# Patient Record
Sex: Female | Born: 1975 | Hispanic: Yes | Marital: Married | Smoking: Never smoker
Health system: Southern US, Community
[De-identification: ages and names within clinical notes are randomized; demographics above are authoritative.]

## PROBLEM LIST (undated history)

## (undated) ENCOUNTER — Inpatient Hospital Stay (HOSPITAL_COMMUNITY): Payer: Self-pay

## (undated) DIAGNOSIS — Z789 Other specified health status: Secondary | ICD-10-CM

## (undated) HISTORY — DX: Other specified health status: Z78.9

## (undated) HISTORY — PX: EYE SURGERY: SHX253

---

## 2011-04-08 NOTE — L&D Delivery Note (Signed)
I have seen and examined this patient and I agree with the above. Cam Hai 11:57 PM 03/26/2012

## 2011-04-08 NOTE — L&D Delivery Note (Signed)
Delivery Note At 9:18 PM a viable female was delivered via Vaginal, Spontaneous Delivery (Presentation: ; Occiput Anterior).  APGAR: 8, 9; weight is pending .   Placenta status: Intact, Spontaneous.  Cord: 3 vessels with the following complications: None.  Anesthesia: Epidural  Episiotomy: None Lacerations: 2nd degree, perineal Suture Repair: 3.0 vicryl Est. Blood Loss (mL): 200  Mom to postpartum.  Baby to nursery-stable.  Governor Specking 03/26/2012, 9:48 PM

## 2011-12-17 ENCOUNTER — Ambulatory Visit (INDEPENDENT_AMBULATORY_CARE_PROVIDER_SITE_OTHER): Payer: Self-pay | Admitting: Advanced Practice Midwife

## 2011-12-17 ENCOUNTER — Encounter: Payer: Self-pay | Admitting: Advanced Practice Midwife

## 2011-12-17 VITALS — BP 101/71 | Temp 97.4°F | Ht 61.02 in | Wt 164.8 lb

## 2011-12-17 DIAGNOSIS — O99210 Obesity complicating pregnancy, unspecified trimester: Secondary | ICD-10-CM

## 2011-12-17 DIAGNOSIS — E669 Obesity, unspecified: Secondary | ICD-10-CM

## 2011-12-17 DIAGNOSIS — O0930 Supervision of pregnancy with insufficient antenatal care, unspecified trimester: Secondary | ICD-10-CM

## 2011-12-17 LAB — HIV ANTIBODY (ROUTINE TESTING W REFLEX): HIV: NONREACTIVE

## 2011-12-17 LAB — POCT URINALYSIS DIP (DEVICE)
Nitrite: NEGATIVE
Specific Gravity, Urine: 1.02 (ref 1.005–1.030)
Urobilinogen, UA: 0.2 mg/dL (ref 0.0–1.0)
pH: 7 (ref 5.0–8.0)

## 2011-12-17 NOTE — Progress Notes (Signed)
   Subjective:    Mackenzie Bennett is a [id number] [redacted]w[redacted]d being seen today for her first obstetrical visit.  Her obstetrical history is significant for Late to care.  Has not had prenatal care yet. Delivered other babies in [place], without complications.  Had an informal [place] in [place] with a friend. Denies pain or bleeding. States had normal deliveries with no problems.   Patient does intend to breast feed. Pregnancy history fully reviewed.  Patient reports no complaints.  Filed Vitals:   12/17/11 0833 12/17/11 0839  BP: 101/71   Temp: 97.4 F (36.3 C)   Height:  5' 1.02" (1.55 m)  Weight: 164 lb 12.8 oz (74.753 kg)     HISTORY: OB History    Grav Para Term Preterm Abortions TAB SAB Ect Mult Living   3 2 2   0 0 0   2     # Outc Date GA Lbr Len/2nd Wgt Sex Del Anes PTL Lv   1 TRM 6/04 [redacted]w[redacted]d  6lb(2.722kg) F SVD EPI No Yes   2 TRM 6/06 [redacted]w[redacted]d  6lb(2.722kg) F SVD EPI No Yes   3 CUR              Past Medical History  Diagnosis Date  . No pertinent past medical history    Past Surgical History  Procedure Date  . Eye surgery     both eyes, for pupil   Family History  Problem Relation Age of Onset  . Hypertension Mother   . Cancer Maternal Uncle     lung     Exam    Uterus:     Pelvic Exam:    Perineum: No Hemorrhoids, Normal Perineum   Vulva: normal, Bartholin's, Urethra, Skene's normal   Vagina:  normal mucosa, normal discharge   pH:    Cervix: multiparous appearance   Adnexa: normal adnexa and no mass, fullness, tenderness   Bony Pelvis: gynecoid  System: Breast:  normal appearance, no masses or tenderness   Skin: normal coloration and turgor, no rashes    Neurologic: oriented   Extremities: normal strength, tone, and muscle mass   HEENT    Mouth/Teeth mucous membranes moist, pharynx normal without lesions   Neck supple and no masses   Cardiovascular: regular rate and rhythm, no murmurs or gallops   Respiratory:  appears well, vitals normal, no  respiratory distress, acyanotic, normal RR, ear and throat exam is normal, neck free of mass or lymphadenopathy, chest clear, no wheezing, crepitations, rhonchi, normal symmetric air entry   Abdomen: soft, non-tender; bowel sounds normal; no masses,  no organomegaly   Urinary: urethral meatus normal      Assessment:    Pregnancy: [id number] There is no problem list on file for this patient.       Plan:     Initial labs drawn. Prenatal vitamins. Problem list reviewed and updated. Genetic Screening discussed First Screen: declined.(too late)  Ultrasound discussed; fetal survey: ordered.  Follow up in 4 weeks. 50% of 20 min visit spent on counseling and coordination of care.   Prenatal labs done Glucola done Pap and cultures done    [facility] 12/17/2011

## 2011-12-17 NOTE — Patient Instructions (Signed)
Embarazo - Segundo trimestre (Pregnancy - Second Trimester) El segundo trimestre del embarazo (del 3 al 6mes) es un perodo de evolucin rpida para usted y el beb. Hacia el final del sexto mes, el beb mide aproximadamente 23 cm y pesa 680 g. Comenzar a sentir los movimientos del beb entre las 18 y las 20 semanas de embarazo. Podr sentir las pataditas ("quickening en ingls"). Hay un rpido aumento de peso. Puede segregar un lquido claro (calostro) de las mamas. Quizs sienta pequeas contracciones en el vientre (tero) Esto se conoce como falso trabajo de parto o contracciones de Braxton-Hicks. Es como una prctica del trabajo de parto que se produce cuando el beb est listo para salir. Generalmente los problemas de vmitos matinales ya se han superado hacia el final del primer trimestre. Algunas mujeres desarrollan pequeas manchas oscuras (que se denominan cloasma, mscara del embarazo) en la cara que normalmente se van luego del nacimiento del beb. La exposicin al sol empeora las manchas. Puede desarrollarse acn en algunas mujeres embarazadas, y puede desaparecer en aquellas que ya tienen acn. EXAMENES PRENATALES  Durante los exmenes prenatales, deber seguir realizando pruebas de sangre, segn avance el embarazo. Estas pruebas se realizan para controlar su salud y la del beb. Tambin se realizan anlisis de sangre para conocer los niveles de hemoglobina. La anemia (bajo nivel de hemoglobina) es frecuente durante el embarazo. Para prevenirla, se administran hierro y vitaminas. Tambin se le realizarn exmenes para saber si tiene diabetes entre las 24 y las 28 semanas del embarazo. Podrn repetirle algunas de las pruebas que le hicieron previamente.   En cada visita le medirn el tamao del tero. Esto se realiza para asegurarse de que el beb est creciendo correctamente de acuerdo al estado del embarazo.   Tambin en cada visita prenatal controlarn su presin arterial. Esto se realiza  para asegurarse de que no tenga toxemia.   Se controlar su orina para asegurarse de que no tenga infecciones, diabetes o protena en la orina.   Se controlar su peso regularmente para asegurarse que el aumento ocurre al ritmo indicado. Esto se hace para asegurarse que usted y el beb tienen una evolucin normal.   En algunas ocasiones se realiza una prueba de ultrasonido para confirmar el correcto desarrollo y evolucin del beb. Esta prueba se realiza con ondas sonoras inofensivas para el beb, de modo que el profesional pueda calcular ms precisamente la fecha del parto.  Algunas veces se realizan pruebas especializadas del lquido amnitico que rodea al beb. Esta prueba se denomina amniocentesis. El lquido amnitico se obtiene introduciendo una aguja en el vientre (abdomen). Se realiza para controlar los cromosomas en aquellos casos en los que existe alguna preocupacin acerca de algn problema gentico que pueda sufrir el beb. En ocasiones se lleva a cabo cerca del final del embarazo, si es necesario inducir al parto. En este caso se realiza para asegurarse que los pulmones del beb estn lo suficientemente maduros como para que pueda vivir fuera del tero. CAMBIOS QUE OCURREN EN EL SEGUNDO TRIMESTRE DEL EMBARAZO Su organismo atravesar numerosos cambios durante el embarazo. Estos pueden variar de una persona a otra. Converse con el profesional que la asiste acerca los cambios que usted note y que la preocupen.  Durante el segundo trimestre probablemente sienta un aumento del apetito. Es normal tener "antojos" de ciertas comidas. Esto vara de una persona a otra y de un embarazo a otro.   El abdomen inferior comenzar a abultarse.   Podr tener la necesidad   de orinar con ms frecuencia debido a que el tero y el beb presionan sobre la vejiga. Tambin es frecuente contraer ms infecciones urinarias durante el embarazo (dolor al orinar). Puede evitarlas bebiendo gran cantidad de lquidos y  vaciando la vejiga antes y despus de mantener relaciones sexuales.   Podrn aparecer las primeras estras en las caderas, abdomen y mamas. Estos son cambios normales del cuerpo durante el embarazo. No existen medicamentos ni ejercicios que puedan prevenir estos cambios.   Es posible que comience a desarrollar venas inflamadas y abultadas (varices) en las piernas. El uso de medias de descanso, elevar sus pies durante 15 minutos, 3 a 4 veces al da y limitar la sal en su dieta ayuda a aliviar el problema.   Podr sentir acidez gstrica a medida que el tero crece y presiona contra el estmago. Puede tomar anticidos, con la autorizacin de su mdico, para aliviar este problema. Tambin es til ingerir pequeas comidas 4 a 5 veces al da.   La constipacin puede tratarse con un laxante o agregando fibra a su dieta. Beber grandes cantidades de lquidos, comer vegetales, frutas y granos integrales es de gran ayuda.   Tambin es beneficioso practicar actividad fsica. Si ha sido una persona activa hasta el embarazo, podr continuar con la mayora de las actividades durante el mismo. Si ha sido menos activa, puede ser beneficioso que comience con un programa de ejercicios, como realizar caminatas.   Puede desarrollar hemorroides (vrices en el recto) hacia el final del segundo trimestre. Tomar baos de asiento tibios y utilizar cremas recomendadas por el profesional que lo asiste sern de ayuda para los problemas de hemorroides.   Tambin podr sentir dolor de espalda durante este momento de su embarazo. Evite levantar objetos pesados, utilice zapatos de taco bajo y mantenga una buena postura para ayudar a reducir los problemas de espalda.   Algunas mujeres embarazadas desarrollan hormigueo y adormecimiento de la mano y los dedos debido a la hinchazn y compresin de los ligamentos de la mueca (sndrome del tnel carpiano). Esto desaparece una vez que el beb nace.   Como sus pechos se agrandan,  necesitar un sujetador ms grande. Use un sostn de soporte, cmodo y de algodn. No utilice un sostn para amamantar hasta el ltimo mes de embarazo si va a amamantar al beb.   Podr observar una lnea oscura desde el ombligo hacia la zona pbica denominada linea nigra.   Podr observar que sus mejillas se ponen coloradas debido al aumento de flujo sanguneo en la cara.   Podr desarrollar "araitas" en la cara, cuello y pecho. Esto desaparece una vez que el beb nace.  INSTRUCCIONES PARA EL CUIDADO DOMICILIARIO  Es extremadamente importante que evite el cigarrillo, hierbas medicinales, alcohol y las drogas no prescriptas durante el embarazo. Estas sustancias qumicas afectan la formacin y el desarrollo del beb. Evite estas sustancias durante todo el embarazo para asegurar el nacimiento de un beb sano.   La mayor parte de los cuidados que se aconsejan son los mismos que los indicados para el primer trimestre del embarazo. Cumpla con las citas tal como se le indic. Siga las instrucciones del profesional que lo asiste con respecto al uso de los medicamentos, el ejercicio y la dieta.   Durante el embarazo debe obtener nutrientes para usted y para su beb. Consuma alimentos balanceados a intervalos regulares. Elija alimentos como carne, pescado, leche y otros productos lcteos descremados, vegetales, frutas, panes integrales y cereales. El profesional le informar cul es el   aumento de peso ideal.   Las relaciones sexuales fsicas pueden continuarse hasta cerca del fin del embarazo si no existen otros problemas. Estos problemas pueden ser la prdida temprana (prematura) de lquido amnitico de las membranas, sangrado vaginal, dolor abdominal u otros problemas mdicos o del embarazo.   Realice actividad fsica todos los das, si no tiene restricciones. Consulte con el profesional que la asiste si no sabe con certeza si determinados ejercicios son seguros. El mayor aumento de peso tiene lugar  durante los ltimos 2 trimestres del embarazo. El ejercicio la ayudar a:   Controlar su peso.   Ponerla en forma para el parto.   Ayudarla a perder peso luego de haber dado a luz.   Use un buen sostn o como los que se usan para hacer deportes para aliviar la sensibilidad de las mamas. Tambin puede serle til si lo usa mientras duerme. Si pierde calostro, podr utilizar apsitos en el sostn.   No utilice la baera con agua caliente, baos turcos y saunas durante el embarazo.   Utilice el cinturn de seguridad sin excepcin cuando conduzca. Este la proteger a usted y al beb en caso de accidente.   Evite comer carne cruda, queso crudo, y el contacto con los utensilios y desperdicios de los gatos. Estos elementos contienen grmenes que pueden causar defectos de nacimiento en el beb.   El segundo trimestre es un buen momento para visitar a su dentista y evaluar su salud dental si an no lo ha hecho. Es importante mantener los dientes limpios. Utilice un cepillo de dientes blando. Cepllese ms suavemente durante el embarazo.   Es ms fcil perder algo de orina durante el embarazo. Apretar y fortalecer los msculos de la pelvis la ayudar con este problema. Practique detener la miccin cuando est en el bao. Estos son los mismos msculos que necesita fortalecer. Son tambin los mismos msculos que utiliza cuando trata de evitar los gases. Puede practicar apretando estos msculos 10 veces, y repetir esto 3 veces por da aproximadamente. Una vez que conozca qu msculos debe apretar, no realice estos ejercicios durante la miccin. Puede favorecerle una infeccin si la orina vuelve hacia atrs.   Pida ayuda si tiene necesidades econmicas, de asesoramiento o nutricionales durante el embarazo. El profesional podr ayudarla con respecto a estas necesidades, o derivarla a otros especialistas.   La piel puede ponerse grasa. Si esto sucede, lvese la cara con un jabn suave, utilice un humectante no  graso y maquillaje con base de aceite o crema.  CONSUMO DE MEDICAMENTOS Y DROGAS DURANTE EL EMBARAZO  Contine tomando las vitaminas apropiadas para esta etapa tal como se le indic. Las vitaminas deben contener un miligramo de cido flico y deben suplementarse con hierro. Guarde todas las vitaminas fuera del alcance de los nios. La ingestin de slo un par de vitaminas o tabletas que contengan hierro puede ocasionar la muerte en un beb o en un nio pequeo.   Evite el uso de medicamentos, inclusive los de venta libre y hierbas que no hayan sido prescriptos o indicados por el profesional que la asiste. Algunos medicamentos pueden causar problemas fsicos al beb. Utilice los medicamentos de venta libre o de prescripcin para el dolor, el malestar o la fiebre, segn se lo indique el profesional que lo asiste. No utilice aspirina.   El consumo de alcohol est relacionado con ciertos defectos de nacimiento. Esto incluye el sndrome de alcoholismo fetal. Debe evitar el consumo de alcohol en cualquiera de sus formas. El cigarrillo   causa nacimientos prematuros y bebs de bajo peso. El uso de drogas recreativas est absolutamente prohibido. Son muy nocivas para el beb. Un beb que nace de una madre adicta, ser adicto al nacer. Ese beb tendr los mismos sntomas de abstinencia que un adulto.   Infrmele al profesional si consume alguna droga.   No consuma drogas ilegales. Pueden causarle mucho dao al beb.  SOLICITE ATENCIN MDICA SI: Tiene preguntas o preocupaciones durante su embarazo. Es mejor que llame para consultar las dudas que esperar hasta su prxima visita prenatal. De esta forma se sentir ms tranquila.  SOLICITE ATENCIN MDICA DE INMEDIATO SI:  La temperatura oral se eleva sin motivo por encima de 102 F (38.9 C) o segn le indique el profesional que lo asiste.   Tiene una prdida de lquido por la vagina (canal de parto). Si sospecha una ruptura de las membranas, tmese la  temperatura y llame al profesional para informarlo sobre esto.   Observa unas pequeas manchas, una hemorragia vaginal o elimina cogulos. Notifique al profesional acerca de la cantidad y de cuntos apsitos est utilizando. Unas pequeas manchas de sangre son algo comn durante el embarazo, especialmente despus de mantener relaciones sexuales.   Presenta un olor desagradable en la secrecin vaginal y observa un cambio en el color, de transparente a blanco.   Contina con las nuseas y no obtiene alivio de los remedios indicados. Vomita sangre o algo similar a la borra del caf.   [place] o sube ms de 900 g. en una semana, o segn lo indicado por el profesional que la asiste.   Observa que se le hinchan el rostro, las manos, los pies o las piernas.   Ha estado expuesta a la rubola y no ha sufrido la enfermedad.   Ha estado expuesta a la quinta enfermedad o a la varicela.   Presenta dolor abdominal. Las molestias en el ligamento redondo son una causa no cancerosa (benigna) frecuente de dolor abdominal durante el embarazo. El profesional que la asiste deber evaluarla.   Presenta dolor de cabeza intenso que no se alivia.   Presenta fiebre, diarrea, dolor al orinar o le falta la respiracin.   Presenta dificultad para ver, visin borrosa, o visin doble.   Sufre una cada, un accidente de trnsito o cualquier tipo de trauma.   Vive en un hogar en el que existe violencia fsica o mental.  Document Released: 01/01/2005 Document Revised: 03/13/2011 [employer] Patient Information 2012 [employer]. 

## 2011-12-17 NOTE — Progress Notes (Signed)
Pulse: 88 Pt notices a slight odor with yellow discharge. 1hr gtt today due at 940

## 2011-12-18 LAB — OBSTETRIC PANEL
Antibody Screen: NEGATIVE
Basophils Absolute: 0 10*3/uL (ref 0.0–0.1)
Eosinophils Absolute: 0.1 10*3/uL (ref 0.0–0.7)
Eosinophils Relative: 1 % (ref 0–5)
HCT: 37.9 % (ref 36.0–46.0)
Lymphocytes Relative: 15 % (ref 12–46)
Lymphs Abs: 1.4 10*3/uL (ref 0.7–4.0)
MCH: 30.9 pg (ref 26.0–34.0)
MCV: 92.2 fL (ref 78.0–100.0)
Monocytes Absolute: 0.5 10*3/uL (ref 0.1–1.0)
Platelets: 250 10*3/uL (ref 150–400)
RDW: 13.7 % (ref 11.5–15.5)
Rubella: 220.2 IU/mL — ABNORMAL HIGH
WBC: 9.6 10*3/uL (ref 4.0–10.5)

## 2011-12-19 ENCOUNTER — Ambulatory Visit (HOSPITAL_COMMUNITY)
Admission: RE | Admit: 2011-12-19 | Discharge: 2011-12-19 | Disposition: A | Discharge status: Home / Self Care | Payer: Self-pay | Source: Ambulatory Visit | Attending: Advanced Practice Midwife | Admitting: Advanced Practice Midwife

## 2011-12-19 DIAGNOSIS — O0930 Supervision of pregnancy with insufficient antenatal care, unspecified trimester: Secondary | ICD-10-CM | POA: Insufficient documentation

## 2011-12-19 DIAGNOSIS — Z1389 Encounter for screening for other disorder: Secondary | ICD-10-CM | POA: Insufficient documentation

## 2011-12-19 DIAGNOSIS — Z363 Encounter for antenatal screening for malformations: Secondary | ICD-10-CM | POA: Insufficient documentation

## 2011-12-19 DIAGNOSIS — O09529 Supervision of elderly multigravida, unspecified trimester: Secondary | ICD-10-CM | POA: Insufficient documentation

## 2011-12-19 DIAGNOSIS — O358XX0 Maternal care for other (suspected) fetal abnormality and damage, not applicable or unspecified: Secondary | ICD-10-CM | POA: Insufficient documentation

## 2011-12-19 LAB — HEMOGLOBINOPATHY EVALUATION
Hgb F Quant: 0.3 % (ref 0.0–2.0)
Hgb S Quant: 0 %

## 2011-12-20 LAB — CULTURE, OB URINE: Colony Count: 50000

## 2011-12-23 ENCOUNTER — Encounter: Payer: Self-pay | Admitting: Advanced Practice Midwife

## 2011-12-23 DIAGNOSIS — O359XX0 Maternal care for (suspected) fetal abnormality and damage, unspecified, not applicable or unspecified: Secondary | ICD-10-CM | POA: Insufficient documentation

## 2011-12-25 ENCOUNTER — Encounter: Payer: Self-pay | Admitting: Advanced Practice Midwife

## 2011-12-25 DIAGNOSIS — Z3490 Encounter for supervision of normal pregnancy, unspecified, unspecified trimester: Secondary | ICD-10-CM | POA: Insufficient documentation

## 2012-01-14 ENCOUNTER — Ambulatory Visit (INDEPENDENT_AMBULATORY_CARE_PROVIDER_SITE_OTHER): Payer: Self-pay | Admitting: Obstetrics and Gynecology

## 2012-01-14 VITALS — BP 113/69 | Temp 97.1°F | Wt 167.1 lb

## 2012-01-14 DIAGNOSIS — O359XX0 Maternal care for (suspected) fetal abnormality and damage, unspecified, not applicable or unspecified: Secondary | ICD-10-CM

## 2012-01-14 DIAGNOSIS — O0930 Supervision of pregnancy with insufficient antenatal care, unspecified trimester: Secondary | ICD-10-CM

## 2012-01-14 LAB — POCT URINALYSIS DIP (DEVICE)
Bilirubin Urine: NEGATIVE
Glucose, UA: NEGATIVE mg/dL
Ketones, ur: NEGATIVE mg/dL
Nitrite: NEGATIVE

## 2012-01-14 NOTE — Progress Notes (Signed)
F/U anatomy scheduled for next week. Discussed LARC and infor given as she has only used condoms in past due to fear of infections. Mild gingivitis - discussed dental hygiene.

## 2012-01-14 NOTE — Progress Notes (Signed)
Pulse- 83  Edema-"swollen gums; happened in my last pregnancy"  Pain- right side "I am on my feet a lot at work"

## 2012-01-14 NOTE — Patient Instructions (Signed)
Eleccin del mtodo anticonceptivo  (Contraception Choices) El control de la natalidad (contracecin) impide que el embarazo ocurra. Los diferentes tipos de anticonceptivos funcionan de diferentes maneras. Algunos pueden:   Hacer que el moco del cuello del tero se espese. Esto les dificulta a los espermatozoides llegar al tero.  Hacer ms delgado el el revestimiento del tero. Esto dificulta que el vulo se adhiera a la pared del tero.  Impedir que los ovarios liberen un vulo.  Impedir que el esperma llegue al vulo. Con ciertos tipos de ciruga se puede impedir que el embarazo ocurra. En las mujeres, una ciruga cierra las trompas de Falopio (ligadura de trompas). En los hombres, la ciruga impide que los espermatozoides se liberen durante el acto sexual (vasectoma).  ANTICONCEPTIVOS HORMONALES  Los anticonceptivos hormonales impiden el embarazo, agregando hormonas al organismo. Los tipos de anticonceptivos son:  Un pequeo tubo colocado bajo la piel de la parte superior del brazo (implante). El tubo puede permanecer en el lugar durante 3 aos.  Vacunas administradas cada 3 meses.  Pastillas que se toman todos los das o una vez despus de tener sexo (relaciones sexuales).  Parches que se cambian una vez por semana.  Un anillo que se coloca en la vagina (anillos vaginales). El anillo se deja en su lugar durante 3 semanas y se retira durante 1 semana Luego se coloca un nuevo anillo. ANTICONCEPTIVOS DE BARRERA  Los anticonceptivos de barrera impiden que los espermatozoides lleguen al vulo. Estos tipos de anticonceptivos son:   Una cubierta delgada que se usa sobe el pene (condn masculino) que se coloca durante las relaciones sexuales.  Una cubierta blanda y suelta que se coloca en la vagina (condn femenino) antes de las relaciones sexuales.  Un cuenco de goma que se aplica sobre el cuello del tero (diafragma). Este cuenco debe hacerse para usted. Se coloca en la vagina antes de  tener relaciones sexuales. Debe dejar el diafragma colocado en la vagina durante 6 a 8 horas despus de las relaciones sexuales.  Un capuchn pequeo y suave que se fijo sobre el cuello del tero (capuchn cervical). Este capuchn debe hacerse para usted. Debe dejarlo colocado en la vagina durante 48 horas despus de las relaciones sexuales.  Una esponja que se coloca en la vagina antes de tener relaciones sexuales.  Una sustancia qumica que destruye o impide que los espermatozoides ingresen al cuello y al tero (espermicida). La sustancia qumica puede ser en crema, gel, espuma o pldoras. DISPOSITIVO DE CONTROL INTRAUTERINO (DIU)  El DIU es un pequeo dispositivo plstico en forma de T. Se coloca dentro del tero. Hay dos tipos de DIU:   DIU de cobre El dispositivo est cubierto en alambre de cobre. El cobre produce un lquido que destruye los espermatozoides. Puede permanecer colocado durante 10 aos.  DIU hormonal La hormona impide que ocurra el embarazo. Puede permanecer colocado durante 5 aos. CONTROL DE LA NATALIDAD POR PLANIFICACIN FAMILIAR NATURAL  La planificacin familiar natural significa no tener relaciones sexuales o usar un mtodo anticonceptivo de barrera en los perodos frtiles de la mujer. Una mujer puede:   Usar un calendario para saber cul es su perodo frtil.  Emplear un termmetro para medir la temperatura corporal. Protjase de las enfermedades de transmisin sexual cualquiera sea el mtodo que utilice. Hable con su mdico acerca de cul es el mejor mtodo anticonceptivo para usted.  Document Released: 07/09/2010 Document Revised: 06/16/2011 [employer] Patient Information 2013 [employer].  

## 2012-01-21 ENCOUNTER — Ambulatory Visit (HOSPITAL_COMMUNITY)
Admission: RE | Admit: 2012-01-21 | Discharge: 2012-01-21 | Disposition: A | Discharge status: Home / Self Care | Payer: Self-pay | Source: Ambulatory Visit | Attending: Obstetrics and Gynecology | Admitting: Obstetrics and Gynecology

## 2012-01-21 DIAGNOSIS — O09529 Supervision of elderly multigravida, unspecified trimester: Secondary | ICD-10-CM | POA: Insufficient documentation

## 2012-01-21 DIAGNOSIS — O0930 Supervision of pregnancy with insufficient antenatal care, unspecified trimester: Secondary | ICD-10-CM | POA: Insufficient documentation

## 2012-01-21 DIAGNOSIS — Z363 Encounter for antenatal screening for malformations: Secondary | ICD-10-CM | POA: Insufficient documentation

## 2012-01-21 DIAGNOSIS — Z1389 Encounter for screening for other disorder: Secondary | ICD-10-CM | POA: Insufficient documentation

## 2012-01-21 DIAGNOSIS — O358XX0 Maternal care for other (suspected) fetal abnormality and damage, not applicable or unspecified: Secondary | ICD-10-CM | POA: Insufficient documentation

## 2012-01-21 DIAGNOSIS — O359XX0 Maternal care for (suspected) fetal abnormality and damage, unspecified, not applicable or unspecified: Secondary | ICD-10-CM

## 2012-01-28 ENCOUNTER — Ambulatory Visit (INDEPENDENT_AMBULATORY_CARE_PROVIDER_SITE_OTHER): Payer: Self-pay | Admitting: Family

## 2012-01-28 VITALS — BP 107/66 | Temp 97.3°F | Wt 168.4 lb

## 2012-01-28 DIAGNOSIS — Z3490 Encounter for supervision of normal pregnancy, unspecified, unspecified trimester: Secondary | ICD-10-CM

## 2012-01-28 DIAGNOSIS — O359XX0 Maternal care for (suspected) fetal abnormality and damage, unspecified, not applicable or unspecified: Secondary | ICD-10-CM

## 2012-01-28 LAB — POCT URINALYSIS DIP (DEVICE)
Glucose, UA: NEGATIVE mg/dL
Ketones, ur: NEGATIVE mg/dL
Specific Gravity, Urine: 1.02 (ref 1.005–1.030)

## 2012-01-28 MED ORDER — NITROFURANTOIN MONOHYD MACRO 100 MG PO CAPS: 100.0000 mg | ORAL_CAPSULE | Freq: Two times a day (BID) | ORAL | Status: DC

## 2012-01-28 NOTE — Progress Notes (Signed)
Pulse  91.  Edema trace in feet. States having a thin, clear, "moist" vaginal d/c.

## 2012-01-28 NOTE — Progress Notes (Signed)
Reviews repeat US for recheck cerebral ventricle (normal); reports contractions approx 3x a day; reports intermittent dysuria, large leuk in urine; RX macrobid and urine culture.  Pt denies leaking of fluid, "moist".

## 2012-01-31 LAB — CULTURE, OB URINE: Colony Count: >=100000

## 2012-02-11 ENCOUNTER — Encounter: Payer: Self-pay | Admitting: Advanced Practice Midwife

## 2012-02-18 ENCOUNTER — Ambulatory Visit (INDEPENDENT_AMBULATORY_CARE_PROVIDER_SITE_OTHER): Payer: Self-pay | Admitting: Advanced Practice Midwife

## 2012-02-18 VITALS — BP 107/67 | Temp 97.4°F | Wt 172.0 lb

## 2012-02-18 DIAGNOSIS — Z3490 Encounter for supervision of normal pregnancy, unspecified, unspecified trimester: Secondary | ICD-10-CM

## 2012-02-18 DIAGNOSIS — Z23 Encounter for immunization: Secondary | ICD-10-CM

## 2012-02-18 DIAGNOSIS — Z3480 Encounter for supervision of other normal pregnancy, unspecified trimester: Secondary | ICD-10-CM

## 2012-02-18 LAB — POCT URINALYSIS DIP (DEVICE)
Glucose, UA: NEGATIVE mg/dL
Nitrite: NEGATIVE
Protein, ur: NEGATIVE mg/dL
Specific Gravity, Urine: 1.02 (ref 1.005–1.030)
Urobilinogen, UA: 0.2 mg/dL (ref 0.0–1.0)

## 2012-02-18 MED ORDER — INFLUENZA VIRUS VACC SPLIT PF IM SUSP
0.5000 mL | Freq: Once | INTRAMUSCULAR | Status: AC
  Administered 2012-02-18: 0.5 mL via INTRAMUSCULAR

## 2012-02-18 NOTE — Progress Notes (Signed)
Occasional contractions and pressure, nothing regular, no bleeding or LOF. Flu shot today. Rev'd PTL, kick counts.

## 2012-02-18 NOTE — Patient Instructions (Signed)
Embarazo  Tercer trimestre  (Pregnancy - Third Trimester) El tercer trimestre del embarazo (los ltimos 3 meses) es el perodo en el cual tanto usted como su beb crecen con ms rapidez. El beb alcanza un largo de aproximadamente 50 cm. y pesa entre 2,700 y 4,500 kg. El beb gana ms tejido graso y est listo para la vida fuera del cuerpo de la madre. Mientras estn en el interior, los bebs tienen perodos de sueo y vigilia, succionan el pulgar y tienen hipo. Quizs sienta pequeas contracciones del tero. Este es el falso trabajo de parto. Tambin se las conoce como contracciones de Braxton-Hicks . Es como una prctica del parto. Los problemas ms habituales de esta etapa del embarazo incluyen mayor dificultad para respirar, hinchazn de las manos y los pies por retencin de lquidos y la necesidad de orinar con ms frecuencia debido a que el tero y el beb presionan sobre la vejiga.  EXAMENES PRENATALES   Durante los exmenes prenatales, deber seguir realizndose anlisis de sangre. Estas pruebas se realizan para controlar su salud y la del beb. Los anlisis de sangre se realizan para conocer los niveles de algunos compuestos de la sangre (hemoglobina). La anemia (bajo nivel de hemoglobina) es frecuente durante el embarazo. Para prevenirla, se administran hierro y vitaminas. Tambin le tomarn nuevas anlisis para descartar diabetes. Podrn repetirle algunas de las pruebas que le hicieron previamente.  En cada visita le medirn el tamao del tero. Esto permite asegurar que el beb se desarrolla adecuadamente, segn la fecha del embarazo.  Le controlarn la presin arterial en cada visita prenatal. Esto es para asegurarse de que no sufre toxemia.  Le harn un anlisis de orina en cada visita prenatal, para descartar infecciones, diabetes y la presencia de protenas.  Tambin en cada visita controlarn su peso. Esto se realiza para asegurarse que aumenta de peso al ritmo indicado y que usted y su  beb evolucionan normalmente.  En algunas ocasiones se realiza una prueba de ultrasonido para confirmar el correcto desarrollo y evolucin del beb. Esta prueba se realiza con ondas sonoras inofensivas para el beb, de modo que el profesional pueda calcular ms precisamente la fecha del parto.  Analice con su mdico los analgsicos y la anestesia que recibir durante el trabajo de parto y el parto.  Comente la posibilidad de que necesite una cesrea y qu anestesia se recibir.  Informe a su mdico si sufre violencia familiar mental o fsica. A veces, se indica la prueba especializada sin estrs, la prueba de tolerancia a las contracciones y el perfil biofsico para asegurarse de que el beb no tiene problemas. El estudio del lquido amnitico que rodea al beb se llama amniocentesis. El lquido amnitico se obtiene introduciendo una aguja en el vientre (abdomen ). En ocasiones se lleva a cabo cerca del final del embarazo, si es necesario inducir a un parto. En este caso se realiza para asegurarse que los pulmones del beb estn lo suficientemente maduros como para que pueda vivir fuera del tero. Si los pulmones no han madurado y es peligroso que el beb nazca, se administrar a la madre una inyeccin de cortisona , 1 a 2 das antes del parto. . Esto ayuda a que los pulmones del beb maduren y sea ms seguro su nacimiento.  CAMBIOS QUE OCURREN EN EL TERCER TRIMESTRE DEL EMBARAZO  Su organismo atravesar numerosos cambios durante el embarazo. Estos pueden variar de una persona a otra. Converse con el profesional que la asiste acerca los cambios que   usted note y que la preocupen.   Durante el ltimo trimestre probablemente sienta un aumento del apetito. Es normal tener "antojos" de ciertas comidas. Esto vara de una persona a otra y de un embarazo a otro.  Podrn aparecer las primeras estras en las caderas, abdomen y mamas. Estos son cambios normales del cuerpo durante el embarazo. No existen  medicamentos ni ejercicios que puedan prevenir estos cambios.  La constipacin puede tratarse con un laxante o agregando fibra a su dieta. Beber grandes cantidades de lquidos, tomar fibras en forma de vegetales, frutas y granos integrales es de gran ayuda.  Tambin es beneficioso practicar actividad fsica. Si ha sido una persona activa hasta el embarazo, podr continuar con la mayora de las actividades durante el mismo. Si ha sido menos activa, puede ser beneficioso que comience con un programa de ejercicios, como realizar caminatas. Consulte con el profesional que la asiste antes de comenzar un programa de ejercicios.  Evite el consumo de cigarrillos, el alcohol, los medicamentos no recetados y las "drogas de la calle" durante el embarazo. Estas sustancias qumicas afectan la formacin y el desarrollo del beb. Evite estas sustancias durante todo el embarazo para asegurar el nacimiento de un beb sano.  Podr sentir dolor de espalda, tener vrices en las venas y hemorroides, o si ya los sufra, pueden empeorar.  Durante el tercer trimestre se cansar con ms facilidad, lo cual es normal.  Los movimientos del beb pueden ser ms fuertes y con ms frecuencia.  Puede que note dificultades para respirar normalmente.  El ombligo puede salir hacia afuera.  A veces sale una secrecin amarilla de las mamas, que se llama calostro.  Podr aparecer una secrecin mucosa con sangre. Esto suele ocurrir entre unos pocos das y una semana antes del parto. INSTRUCCIONES PARA EL CUIDADO EN EL HOGAR   Cumpla con las citas de control. Siga las indicaciones del mdico con respecto al uso de medicamentos, los ejercicios y la dieta.  Durante el embarazo debe obtener nutrientes para usted y para su beb. Consuma alimentos balanceados a intervalos regulares. Elija alimentos como carne, pescado, leche y otros productos lcteos descremados, vegetales, frutas, panes integrales y cereales. El mdico le informar  cul es el aumento de peso ideal.  Las relaciones sexuales pueden continuarse hasta casi el final del embarazo, si no se presentan otros problemas como prdida prematura (antes de tiempo) de lquido amnitico, hemorragia vaginal o dolor en el vientre (abdominal).  Realice actividad fsica todos los das, si no tiene restricciones. Consulte con el profesional que la asiste si no sabe con certeza si determinados ejercicios son seguros. El mayor aumento de peso se producir en los ltimos 2 trimestres del embarazo. El ejercicio ayuda a:  Controlar su peso.  Mantenerse en forma para el trabajo de parto y el parto .  Perder peso despus del parto.  Haga reposo con frecuencia, con las piernas elevadas, o segn lo necesite para evitar los calambres y el dolor de cintura.  Use un buen sostn o como los que se usan para hacer deportes para aliviar la sensibilidad de las mamas. Tambin puede serle til si lo usa mientras duerme. Si pierde calostro, podr utilizar apsitos en el sostn.  No utilice la baera con agua caliente, baos turcos y saunas.  Colquese el cinturn de seguridad cuando conduzca. Este la proteger a usted y al beb en caso de accidente.  Evite comer carne cruda y el contacto con los utensilios y desperdicios de los gatos. Estos elementos   contienen grmenes que pueden causar defectos de nacimiento en el beb.  Es fcil perder algo de orina durante el embarazo. Apretar y fortalecer los msculos de la pelvis la ayudar con este problema. Practique detener la miccin cuando est en el bao. Estos son los mismos msculos que necesita fortalecer. Son tambin los mismos msculos que utiliza cuando trata de evitar despedir gases. Puede practicar apretando estos msculos diez veces, y repetir esto tres veces por da aproximadamente. Una vez que conozca qu msculos debe apretar, no realice estos ejercicios durante la miccin. Puede favorecerle una infeccin si la orina vuelve hacia  atrs.  Pida ayuda si tienen necesidades financieras, teraputicas o nutricionales. El profesional podr ayudarla con respecto a estas necesidades, o derivarla a otros especialistas.  Haga una lista de nmeros telefnicos de emergencia y tngalos disponibles.  Planifique como obtener ayuda de familiares o amigos cuando regrese a casa desde el hospital.  Hacer un ensayo sobre la partida al hospital.  Tome clases prenatales con el padre para entender, practicar y hacer preguntas sobre el trabajo de parto y el alumbramiento.  Preparar la habitacin del beb / busque una guardera.  No viaje fuera de la ciudad a menos que sea absolutamente necesario y con el asesoramiento de su mdico.  Use slo zapatos de tacn bajo o sin tacn para tener mejor equilibrio y evitar cadas. USO DE MEDICAMENTOS Y CONSUMO DE DROGAS DURANTE EL EMBARAZO   Tome las vitaminas apropiadas para esta etapa tal como se le indic. Las vitaminas deben contener un miligramo de cido flico. Guarde todas las vitaminas fuera del alcance de los nios. La ingestin de slo un par de vitaminas o tabletas que contengan hierro pueden ocasionar la muerte en un beb o en un nio pequeo.  Evite el uso de todos los medicamentos, incluyendo hierbas, medicamentos de venta libre, sin receta o que no hayan sido sugeridos por su mdico. Slo tome medicamentos de venta libre o medicamentos recetados para el dolor, el malestar o fiebre como lo indique su mdico. No tome aspirina, ibuprofeno (Motrin, Advil, Nuprin) o naproxeno (Aleve) excepto que su mdico se lo indique.  Infrmele al profesional si consume alguna droga.  El alcohol se relaciona con ciertos defectos congnitos. Incluye el sndrome de alcoholismo fetal. Debe evitar absolutamente el consumo de alcohol, en cualquier forma. El fumar produce baja tasa de natalidad y bebs prematuros.  Las drogas ilegales o de la calle son muy perjudiciales para el beb. Estn absolutamente  prohibidas. Un beb que nace de una madre adicta, ser adicto al nacer. Ese beb tendr los mismos sntomas de abstinencia que un adulto. SOLICITE ATENCIN MDICA SI:  Tiene preguntas o preocupaciones relacionadas con el embarazo. Es mejor que llame para formular las preguntas si no puede esperar hasta la prxima visita, que sentirse preocupada por ellas.  DECISIONES ACERCA DE LA CIRCUNCISIN  Usted puede saber o no cul es el sexo de su beb. Si ya sabe que ser un varn, este es el momento de pensar acerca de la circuncisin. La circuncisin es la extirpacin del prepucio. Esta es la piel que cubre el extremo sensible del pene. No hay un motivo mdico que lo justifique. Generalmente la decisin se toma segn lo que sea popular en ese momento, o segn creencias religiosas. Podr conversar estos temas con su mdico o con el pediatra.  SOLICITE ATENCIN MDICA DE INMEDIATO SI:   La temperatura oral le sube a ms de 102 F (38.9 C) o lo que su mdico le   indique.  Tiene una prdida de lquido por la vagina (canal de parto). Si sospecha una ruptura de las membranas, tmese la temperatura y llame al profesional para informarlo sobre esto.  Observa unas pequeas manchas, una hemorragia vaginal o elimina cogulos. Notifique al profesional acerca de la cantidad y de cuntos apsitos est utilizando.  Presenta un olor desagradable en la secrecin vaginal y observa un cambio en el color, de transparente a blanco.  Ha vomitado durante ms de 24 horas.  Siente escalofros o le sube la fiebre.  Le falta el aire.  Siente ardor al orinar.  Baja o sube ms de 2 libras (900 g), o segn lo indicado por el profesional que la asiste.  Observa que sbitamente se le hinchan el rostro, las manos, los pies o las piernas.  Siente dolor en el vientre (abdominal). Las molestias en el ligamento redondo son una causa benigna frecuente de dolor abdominal durante el embarazo. El profesional que la asiste deber  evaluarla.  Presenta dolor de cabeza intenso que no se alivia.  Tiene problemas visuales, visin doble o borrosa.  Si no siente los movimientos del beb durante ms de 1 hora. Si piensa que el beb no se mueve tanto como lo haca habitualmente, coma algo que contenga azcar y recustese sobre el lado izquierdo durante una hora. El beb debe moverse al menos 4  5 veces por hora. Comunquese inmediatamente si el beb se mueve menos que lo indicado.  Se cae, se ve involucrada en un accidente automovilstico o sufre algn tipo de traumatismo.  En su hogar hay violencia mental o fsica. Document Released: 01/01/2005 Document Revised: 09/23/2011 [employer] Patient Information 2013 [employer]. Eleccin del mtodo anticonceptivo  (Contraception Choices) La anticoncepcin (control de la natalidad) es el uso de cualquier mtodo o dispositivo para evitar el embarazo. A continuacin se indican algunos de esos mtodos.  MTODOS HORMONALES   Implante anticonceptivo. Es un tubo plstico delgado que contiene la hormona progesterona. No contiene estrgenos. El mdico inserta el tubo en la parte interna del brazo. El tubo puede permanecer en el lugar durante 3 aos. Despus de los 3 aos debe retirarse. El implante impide que los ovarios liberen vulos (ovulacin), espesa el moco cervical, lo que evita que los espermatozoides ingresen al tero y hace ms delgada la membrana que cubre el interior del tero.  Inyecciones de progesterona sola. Estas inyecciones se administran cada 3 meses para evitar el embarazo. La progesterona sinttica impide que los ovarios liberen vulos. Tambin hace que el moco cervical se espese y modifica el recubrimiento interno del tero. Esto hace ms difcil que los espermatozoides sobrevivan en el tero.  Pldoras anticonceptivas. Las pldoras anticonceptivas contienen estrgenos y progesterona. Actan impidiendo que el vulo se forme en el ovario(ovulacin). Las pldoras  anticonceptivas son recetadas por el mdico.Tambin se utilizan para tratar los perodos menstruales abundantes.  Minipldora. Este tipo de pldora anticonceptiva contiene slo hormona progesterona. Deben tomarse todos los das del mes y debe recetarlas el mdico.  Parches anticonceptivos. El parche contiene hormonas similares a las que contienen las pldoras anticonceptivas. Deben cambiarse una vez por semana y se utilizan bajo prescripcin mdica.  Anillo vaginal. Anillo vaginal contiene hormonas similares a las que contienen las pldoras anticonceptivas. Se deja colocado durante tres semanas, se lo retira durante 1 semana y luego se coloca uno nuevo. La paciente debe sentirse cmoda para insertar y retirar el anillo de la vagina.Es necesaria la receta del mdico.  Anticonceptivos de emergencia. Los anticonceptivos de emergencia   son mtodos para evitar un embarazo despus de una relacin sexual sin proteccin. Esta pldora puede tomarse inmediatamente despus de tener relaciones sexuales o hasta 5 das de haber tenido sexo sin proteccin. Es ms efectiva si se toma poco tiempo despus. Los anticonceptivos de emergencia estn disponibles sin prescripcin mdica. Consltelo con su farmacutico. No use los anticonceptivos de emergencia como nico mtodo anticonceptivo. MTODOS DE BARRERA   Condn masculino. Es una vaina delgada (ltex o goma) que se usa en el pene durante el acto sexual. Puede usarse con espermicida para aumentar la efectividad.  Condn femenino. Es una vaina blanda y floja que se adapta suavemente a la vagina antes de las relaciones sexuales.  Diafragma. Es una barrera de ltex redonda y suave que debe ser ajustada por un profesional. Se inserta en la vagina, junto con un gel espermicida. Debe insertarse antes de tener relaciones sexuales. Debe dejar el diafragma colocado en la vagina durante 6 a 8 horas despus de la relacin sexual.  Capuchn cervical. Es una taza de ltex o  plstico, redonda y suave que cubre el cuello del tero y debe ser ajustada por un mdico. Puede dejarlo colocado en la vagina hasta 48 horas despus de las relaciones sexuales.  Esponja. Es una pieza blanda y circular de espuma de poliuretano. Contiene un espermicida. Se inserta en la vagina despus de mojarla y antes de las relaciones sexuales.  Espermicidas. Los espermicidas son qumicos que matan o bloquean el esperma y no lo dejan ingresar al cuello del tero y al tero. Vienen en forma de cremas, geles, supositorios, espuma o comprimidos. No es necesario tener receta mdica. Se insertan en la vagina con un aplicador antes de tener relaciones sexuales. El proceso debe repetirse cada vez que tiene relaciones sexuales. ANTICONCEPTIVOS INTRAUTERINOS   Dispositivo intrauterino (DIU). Es un dispositivo en forma de T que se coloca en el tero durante el perodo menstrual, para evitar el embarazo. Hay dos tipos:  DIU de cobre. Este tipo de DIU est recubierto con un alambre de cobre y se inserta dentro del tero. El cobre hace que el tero y las trompas de Falopio produzcan un liquido que destruye los espermatozoides. Puede permanecer colocado durante 10 aos.  DIU hormonal. Este tipo de DIU contiene la hormona progestina (progesterona sinttica). La hormona espesa el moco cervical y evita que los espermatozoides ingresen al tero y tambin afina la membrana que cubre el tero para evitar la implantacin del vulo fertilizado. La hormona debilita o destruye los espermatozoides que ingresan al tero. Puede permanecer colocado durante 5 aos. MTODOS ANTICONCEPTIVOS PERMANENTES   Ligadura de trompas en la mujer. La ligadura de trompas en la mujer se realiza sellando, atando u obstruyendo quirrgicamente las trompas de Falopio lo que impide que el vulo descienda hacia el tero.  Esterilizacin masculina. Se realiza atando los conductos por los que pasan los espermatozoides (vasectoma).Esto impide que  el esperma ingrese a la vagina durante el acto sexual. Luego del procedimiento, el hombre puede eyacular lquido (semen). MTODOS DE PLANIFICACIN NATURAL   Planificacin familiar natural.  Consiste en no tener relaciones sexuales o usar un mtodo de barrera (condn, diafragma, capuchn cervical) en los das que la mujer podra quedar embarazada.  Mtodo calendario.  Consiste en el seguimiento de la duracin de cada ciclo menstrual y la identificacin de los perodos frtiles.  Mtodo de la ovulacin.  Consiste en evitar las relaciones sexuales durante la ovulacin.  Mtodo sintotrmico. Consiste en evitar las relaciones sexuales en la poca en   la que se est ovulando, utilizando un termmetro y tendiendo en cuenta los sntomas de la ovulacin.  Mtodo post-ovulacin. Consiste en planificar las relaciones sexuales para despus de haber ovulado. Independientemente del tipo o mtodo anticonceptivo que usted elija, es importante que use condones para protegerse contra las enfermedades de transmisin sexual (ETS). Hable con su mdico con respecto a qu mtodo anticonceptivo es el ms apropiado para usted.  Document Released: 03/24/2005 Document Revised: 06/16/2011 [employer] Patient Information 2013 [employer].  

## 2012-02-18 NOTE — Addendum Note (Signed)
Addended by: Franchot Mimes on: 02/18/2012 12:40 PM   Modules accepted: Orders

## 2012-02-18 NOTE — Addendum Note (Signed)
Addended by: Kathee Delton on: 02/18/2012 01:05 PM   Modules accepted: Orders

## 2012-02-18 NOTE — Progress Notes (Signed)
P=90, c/o trace edema in feet when standing a lot. Used Interpreter Anggie . C/o pelvic pressure sometimes. C/o irregular contractions, states they are not frequent.

## 2012-02-19 LAB — GLUCOSE TOLERANCE, 1 HOUR (50G) W/O FASTING: Glucose, 1 Hour GTT: 85 mg/dL (ref 70–140)

## 2012-03-06 ENCOUNTER — Encounter (HOSPITAL_COMMUNITY): Payer: Self-pay | Admitting: Obstetrics and Gynecology

## 2012-03-06 ENCOUNTER — Inpatient Hospital Stay (HOSPITAL_COMMUNITY)
Admission: AD | Admit: 2012-03-06 | Discharge: 2012-03-06 | Disposition: A | Discharge status: Home / Self Care | Payer: Self-pay | Source: Ambulatory Visit | Attending: Obstetrics and Gynecology | Admitting: Obstetrics and Gynecology

## 2012-03-06 DIAGNOSIS — M545 Low back pain, unspecified: Secondary | ICD-10-CM | POA: Insufficient documentation

## 2012-03-06 DIAGNOSIS — Z3490 Encounter for supervision of normal pregnancy, unspecified, unspecified trimester: Secondary | ICD-10-CM

## 2012-03-06 DIAGNOSIS — K5900 Constipation, unspecified: Secondary | ICD-10-CM | POA: Insufficient documentation

## 2012-03-06 DIAGNOSIS — O0930 Supervision of pregnancy with insufficient antenatal care, unspecified trimester: Secondary | ICD-10-CM

## 2012-03-06 DIAGNOSIS — O99891 Other specified diseases and conditions complicating pregnancy: Secondary | ICD-10-CM | POA: Insufficient documentation

## 2012-03-06 DIAGNOSIS — O359XX0 Maternal care for (suspected) fetal abnormality and damage, unspecified, not applicable or unspecified: Secondary | ICD-10-CM

## 2012-03-06 LAB — URINALYSIS, ROUTINE W REFLEX MICROSCOPIC
Bilirubin Urine: NEGATIVE
Glucose, UA: NEGATIVE mg/dL
Ketones, ur: NEGATIVE mg/dL
Protein, ur: NEGATIVE mg/dL
pH: 6 (ref 5.0–8.0)

## 2012-03-06 LAB — URINE MICROSCOPIC-ADD ON

## 2012-03-06 NOTE — MAU Provider Note (Signed)
History     CSN: [id number]  Arrival date and time: 03/06/12 1800   None     Chief Complaint  Patient presents with  . Back Pain   HPI Mackenzie Bennett is a 36 y.o. [id number] female at [redacted]w[redacted]d who presents w/ constant burning/hot pain in lower back since 2300 last pm, worse when lying/sitting- feels a little better when standing- does not radiate anywhere.  Denies abd pain, uc's, lof, vb.  Reports good fm.  Increased urinary frequency, urgency, and hesitancy x 2 months, 'since I have gotten bigger in my pregnancy'.  Denies dysuria.  Had UTI in Oct, this doesn't feel the same.  Last BM yest.PNC @ [facility], next appointment on Wed 12/4.    OB History    Grav Para Term Preterm Abortions TAB SAB Ect Mult Living   3 2 2   0 0 0   2      Past Medical History  Diagnosis Date  . No pertinent past medical history     Past Surgical History  Procedure Date  . Eye surgery     both eyes, for pupil    Family History  Problem Relation Age of Onset  . Hypertension Mother   . Cancer Maternal Uncle     lung    History  Substance Use Topics  . Smoking status: Never Smoker   . Smokeless tobacco: Never Used  . Alcohol Use: No    Allergies: No Known Allergies  Prescriptions prior to admission  Medication Sig Dispense Refill  . Prenatal Vit-Fe Fumarate-FA (PRENATAL VITAMINS) 28-0.8 MG TABS Take 1 tablet by mouth daily.        Review of Systems  Constitutional: Negative.  Negative for fever and chills.  HENT: Negative.   Eyes: Negative.   Respiratory: Negative.   Cardiovascular: Negative.   Gastrointestinal: Negative.  Negative for abdominal pain.       Last BM yesterday, has had constipation during pregnancy though   Genitourinary: Positive for urgency and frequency. Negative for dysuria, hematuria and flank pain.  Musculoskeletal: Positive for back pain (constant burning lower back pain).  Skin: Negative.   Neurological: Negative.   Endo/Heme/Allergies: Negative.     Psychiatric/Behavioral: Negative.    Physical Exam   Blood pressure 133/68, pulse 90, temperature 97.8 F (36.6 C), temperature source Oral, resp. rate 18, height 5' (1.524 m), weight 79.742 kg (175 lb 12.8 oz), last menstrual period 06/30/2011.  Physical Exam  Constitutional: She is oriented to person, place, and time. She appears well-developed and well-nourished.  HENT:  Head: Normocephalic.  Neck: Normal range of motion.  Cardiovascular: Normal rate.   Respiratory: Effort normal.  GI: Soft.       gravid  Genitourinary: Vagina normal and uterus normal.       Sve: outer os ft/unable to reach inner os, VERY posterior and high Rectum full of hard stool  Musculoskeletal: Normal range of motion.  Neurological: She is alert and oriented to person, place, and time. She has normal reflexes.  Skin: Skin is warm and dry.  Psychiatric: She has a normal mood and affect. Her behavior is normal. Judgment and thought content normal.   FHR: 130, mod variability, 15x15accels, no decels= Cat I FHR UCs: occ, mild- not perceived by pt  MAU Course  Procedures  EFM SVE  Assessment and Plan  A:  [redacted]w[redacted]d SIUP  Low back pain in pregnancy  Constipation  Cat I FHR  P:  D/C home  Reviewed LBP relief  measures (apap, warm bath, heating pad, maternity belt)  Reviewed constipation relief measures (increased water intake, high fiber, walking, warm fluids, stool softeners, miralax)  To keep appt as scheduled in [facility] on Wed 12/4  Return to hosp if needed  Marge Duncans 03/06/2012, 7:06 PM

## 2012-03-06 NOTE — MAU Note (Signed)
Pt reports having constant back pain that started last night. Denies  Any urinarary pain or problems. Good fetal movement reported.

## 2012-03-08 NOTE — MAU Provider Note (Signed)
Attestation of Attending Supervision of Advanced Practitioner (CNM/NP): Evaluation and management procedures were performed by the Advanced Practitioner under my supervision and collaboration.  I have reviewed the Advanced Practitioner's note and chart, and I agree with the management and plan.  Blong Busk 03/08/2012 12:40 PM

## 2012-03-10 ENCOUNTER — Ambulatory Visit (INDEPENDENT_AMBULATORY_CARE_PROVIDER_SITE_OTHER): Payer: Self-pay | Admitting: Obstetrics and Gynecology

## 2012-03-10 VITALS — BP 104/67 | Temp 97.4°F | Wt 176.0 lb

## 2012-03-10 DIAGNOSIS — O0930 Supervision of pregnancy with insufficient antenatal care, unspecified trimester: Secondary | ICD-10-CM

## 2012-03-10 LAB — POCT URINALYSIS DIP (DEVICE)
Bilirubin Urine: NEGATIVE
Ketones, ur: NEGATIVE mg/dL
Specific Gravity, Urine: 1.025 (ref 1.005–1.030)

## 2012-03-10 LAB — OB RESULTS CONSOLE GBS: GBS: NEGATIVE

## 2012-03-10 NOTE — Patient Instructions (Signed)
Embarazo  Systems analyst trimestre  (Pregnancy - Third Trimester) El tercer trimestre del Psychiatrist (los ltimos 3 meses) es el perodo en el cual tanto usted como su beb crecen con ms rapidez. El beb alcanza un largo de aproximadamente 50 cm. y pesa entre 2,700 y 4,500 kg. El beb gana ms tejido graso y est listo para la vida fuera del cuerpo de la [place]. Mientras estn en el interior, los bebs tienen perodos de sueo y vigilia, Warehouse manager y tienen hipo. Quizs sienta pequeas contracciones del tero. Este es el falso trabajo de [place]. Tambin se las conoce como contracciones de [place] . Es como una prctica del parto. Los problemas ms habituales de esta etapa del embarazo incluyen mayor dificultad para respirar, hinchazn de las manos y los pies por retencin de lquidos y la necesidad de Geographical information systems officer con ms frecuencia debido a que el tero y el beb presionan sobre la vejiga.  EXAMENES PRENATALES   Durante los [employer], deber seguir realizndose anlisis de [place]. Estas pruebas se realizan para controlar su salud y la del beb. Los [employer] de sangre se Radiographer, therapeutic para [employer] niveles de algunos compuestos de la sangre (hemoglobina). La anemia (bajo nivel de hemoglobina) es frecuente durante el embarazo. Para prevenirla, se administran hierro y vitaminas. Tambin le tomarn nuevas anlisis para descartar diabetes. Podrn repetirle algunas de las [employer] hicieron previamente.  En cada visita le medirn el tamao del tero. Esto permite asegurar que el beb se desarrolla adecuadamente, segn la fecha del embarazo.  Le controlarn la presin arterial en cada visita prenatal. Esto es para asegurarse de que no sufre toxemia.  Le harn un anlisis de orina en cada visita prenatal, para descartar infecciones, diabetes y la presencia de protenas.  Tambin en cada visita controlarn su peso. Esto se realiza para asegurarse que aumenta de peso al ritmo indicado y que usted y su  beb evolucionan normalmente.  En algunas ocasiones se realiza una prueba de ultrasonido para confirmar el correcto desarrollo y evolucin del beb. Esta prueba se realiza con ondas sonoras inofensivas para el beb, de modo que el profesional pueda calcular ms precisamente la fecha del [place].  Analice con su mdico los analgsicos y la anestesia que recibir durante el [place] de parto y [place].  Comente la posibilidad de que necesite una cesrea y qu anestesia se recibir.  Informe a su mdico si sufre violencia familiar mental o fsica. A veces, se indica la prueba especializada sin estrs, la prueba de tolerancia a las contracciones y el perfil biofsico para asegurarse de que el beb no tiene problemas. El estudio del lquido amnitico que rodea al beb se llama amniocentesis. El lquido amnitico se obtiene introduciendo una aguja en el vientre (abdomen ). En ocasiones se lleva a cabo cerca del final del embarazo, si es necesario inducir a un parto. En este caso se realiza para asegurarse que los pulmones del beb estn lo suficientemente maduros como para que pueda vivir fuera del tero. Si los pulmones no han madurado y es peligroso que el beb nazca, se Building services engineer a la madre una inyeccin de [place] , 1 a 2 [address]  [address] antes del [address]. Vivia Budge ayuda a que los pulmones del beb maduren y sea ms seguro su nacimiento.  CAMBIOS QUE OCURREN EN EL TERCER TRIMESTRE DEL EMBARAZO  Su organismo atravesar numerosos cambios durante el [place]. Estos pueden variar de Neomia Dear persona a otra. Converse con el profesional que la asiste acerca los cambios que  usted note y que la preocupen.   Durante el ltimo trimestre probablemente sienta un aumento del apetito. Es normal tener "antojos" de Development worker, community. Esto vara de Neomia Dear persona a otra y de un embarazo a Therapist, art.  Podrn aparecer las primeras estras en las caderas, abdomen y [place]. Estos son cambios normales del cuerpo durante el [place]. No existen  medicamentos ni ejercicios que puedan prevenir [employer].  La constipacin puede tratarse con un laxante o agregando fibra a su dieta. Beber grandes cantidades de lquidos, tomar fibras en forma de vegetales, frutas y granos integrales es de gran [place].  Tambin es beneficioso practicar actividad fsica. Si ha sido una persona Engineer, mining, podr continuar con la [employer] de las actividades durante el mismo. Si ha sido [employer], puede ser beneficioso que comience con un programa de ejercicios, Museum/gallery exhibitions officer. Consulte con el profesional que la asiste antes de comenzar un programa de ejercicios.  Evite el consumo de cigarrillos, el alcohol, los medicamentos no recetados y las "drogas de la calle" durante el Psychiatrist. Estas sustancias qumicas afectan la formacin y el desarrollo del beb. Evite estas sustancias durante todo el embarazo para asegurar el nacimiento de un beb sano.  Podr sentir dolor de espalda, tener vrices en las venas y hemorroides, o si ya los sufra, pueden [place].  Durante el tercer trimestre se cansar con ms facilidad, lo cual es normal.  Los movimientos del beb pueden ser ms fuertes y con ms frecuencia.  Puede que note dificultades para respirar normalmente.  El ombligo puede salir hacia afuera.  A veces sale Veterinary surgeon de las [place], que se llama Product manager.  Podr aparecer Neomia Dear secrecin mucosa con sangre. Esto suele ocurrir [employer] unos [address] y Neomia Dear semana antes del [place]. INSTRUCCIONES PARA EL CUIDADO EN EL HOGAR   Cumpla con las citas de control. Siga las indicaciones del mdico con respecto al uso de [place], los ejercicios y la dieta.  Durante el embarazo debe obtener nutrientes para usted y para su beb. Consuma alimentos balanceados a intervalos regulares. Elija alimentos como carne, pescado, [place] y otros productos lcteos descremados, vegetales, frutas, panes integrales y cereales. El [employer] informar  cul es el aumento de peso ideal.  Las relaciones sexuales pueden continuarse hasta casi el final del embarazo, si no se presentan otros problemas como prdida prematura (antes de [place]) de lquido amnitico, hemorragia vaginal o dolor en el vientre (abdominal).  Realice [employer], si no tiene restricciones. Consulte con el profesional que la asiste si no sabe con certeza si determinados ejercicios son seguros. El mayor aumento de peso se producir en los ltimos 2 trimestres del Psychiatrist. El ejercicio ayuda a:  Engineering geologist.  Mantenerse en forma para el trabajo de parto y [place] .  Perder peso despus del parto.  Haga reposo con frecuencia, con las piernas elevadas, o segn lo necesite para evitar los calambres y el dolor de cintura.  Use un buen sostn o como los que se usan para hacer deportes para Paramedic la sensibilidad de las [place]. Tambin puede serle til si lo [place] mientras duerme. Si pierde Product manager, podr [employer].  No utilice la baera con agua caliente, baos turcos y saunas.  Colquese el cinturn de seguridad cuando conduzca. Este la proteger a usted y al beb en caso de accidente.  Evite comer carne cruda y el contacto con los utensilios y desperdicios de los gatos. Estos elementos  contienen grmenes que pueden causar defectos de nacimiento en el beb.  Es fcil perder algo de orina durante el [place]. Apretar y Chief Operating Officer los msculos de la pelvis la ayudar con este problema. Practique detener la miccin cuando est en el bao. Estos son los mismos msculos que Development worker, international aid. Son [employer] mismos msculos que utiliza cuando trata de evitar despedir gases. Puede practicar apretando estos msculos [employer], y repetir esto tres veces por da aproximadamente. Una vez que conozca qu msculos debe apretar, no realice estos ejercicios durante la miccin. Puede favorecerle una infeccin si la orina vuelve hacia  atrs.  Pida ayuda si tienen necesidades financieras, teraputicas o nutricionales. El profesional podr ayudarla con respecto a estas necesidades, o derivarla a otros especialistas.  Haga una lista de nmeros telefnicos de emergencia y tngalos disponibles.  Planifique como obtener ayuda de familiares o amigos cuando regrese a Programmer, applications hospital.  Hacer un ensayo sobre la partida al hospital.  [place] clases prenatales con el padre para entender, practicar y hacer preguntas sobre el [place] de parto y el alumbramiento.  Preparar la habitacin del beb / busque Fatima Blank.  No viaje fuera de la ciudad a menos que sea absolutamente necesario y con el asesoramiento de su mdico.  Use slo zapatos de tacn bajo o sin tacn para tener mejor equilibrio y Automotive engineer cadas. USO DE MEDICAMENTOS Y CONSUMO DE DROGAS DURANTE EL [facility]   Tome las vitaminas apropiadas para esta etapa tal como se le indic. Las vitaminas deben contener un miligramo de cido flico. Guarde todas las vitaminas fuera del alcance de los nios. La ingestin de slo un par de vitaminas o tabletas que contengan hierro pueden ocasionar la [employer] en un beb o en un nio pequeo.  Evite el uso de [employer] Mutual [employer] Omaha, incluyendo hierbas, medicamentos de [place], sin receta o que no hayan sido sugeridos por su mdico. Slo tome medicamentos de venta libre o medicamentos recetados para Chief Technology Officer, Environmental health practitioner o fiebre como lo indique su mdico. No tome aspirina, ibuprofeno (Motrin, Advil, Nuprin) o naproxeno (Aleve) excepto que su mdico se lo indique.  Infrmele al profesional si consume alguna droga.  El alcohol se relaciona con ciertos defectos congnitos. Incluye el sndrome de alcoholismo fetal. Debe evitar absolutamente el consumo de alcohol, en cualquier forma. El fumar produce baja tasa de natalidad y bebs prematuros.  Las drogas ilegales o de la calle son muy perjudiciales para el beb. Estn absolutamente  prohibidas. Un beb que nace de [employer], ser adicto al nacer. Ese beb tendr los mismos sntomas de abstinencia que un adulto. SOLICITE ATENCIN MDICA SI:  Tiene preguntas o preocupaciones relacionadas con el embarazo. Es mejor que llame para formular las preguntas si no puede esperar hasta la prxima visita, que sentirse preocupada por ellas.  DECISIONES ACERCA DE LA CIRCUNCISIN  Usted puede saber o no cul es el sexo de su beb. Si ya sabe que ser un varn, este es el momento de pensar acerca de la circuncisin. La circuncisin es la extirpacin del prepucio. Esta es la piel que cubre el extremo sensible del pene. No hay un motivo mdico que lo justifique. Generalmente la decisin se toma segn lo que sea popular en ese momento, o segn creencias religiosas. Podr conversar estos temas con su mdico o con el pediatra.  SOLICITE ATENCIN MDICA DE INMEDIATO SI:   La temperatura oral le sube a ms de 102 F (38.9 C) o lo que su mdico le  indique.  Tiene una prdida de lquido por la vagina (canal de parto). Si sospecha una ruptura de las [place], tmese la temperatura y llame al profesional para informarlo sobre esto.  Observa unas pequeas manchas, una hemorragia vaginal o elimina cogulos. Notifique al profesional acerca de la cantidad y de cuntos apsitos est utilizando.  Presenta un olor desagradable en la secrecin vaginal y observa un cambio en el color, de transparente a blanco.  Ha vomitado durante ms de 24 horas.  Siente escalofros o le sube la fiebre.  Le falta el aire.  Siente ardor al Beatrix Shipper.  Baja o sube ms de 2 libras (900 g), o segn lo indicado por el profesional que la asiste.  Observa que sbitamente se le hinchan el rostro, las manos, los pies o las piernas.  Siente dolor en el vientre (abdominal). Las [employer] en el ligamento redondo son Neomia Dear causa benigna frecuente de dolor abdominal durante el embarazo. El profesional que la asiste deber  evaluarla.  Presenta dolor de cabeza intenso que no se [place].  Tiene problemas visuales, visin doble o borrosa.  Si no siente los movimientos del beb durante ms de 1 hora. Si piensa que el beb no se mueve tanto como lo haca habitualmente, coma algo que Psychologist, clinical y [employer] Corporation lado izquierdo durante [place]. El beb debe moverse al menos 4  5 veces por hora. Comunquese inmediatamente si el beb se mueve menos que lo indicado.  Se cae, se ve involucrada en un accidente automovilstico o sufre algn tipo de traumatismo.  En su hogar hay violencia mental o fsica. Document Released: 01/01/2005 Document Revised: 09/23/2011 [facility] Patient Information 2013 [place], [place].

## 2012-03-10 NOTE — Progress Notes (Signed)
P = 83 Pressure across abdomen with contractions

## 2012-03-10 NOTE — Progress Notes (Signed)
Discussed B-H and s/sx labor. MAU 4 d ago with false labor , cx closed. Cultures done.

## 2012-03-10 NOTE — Addendum Note (Signed)
Addended by: Kathee Delton on: 03/10/2012 10:29 AM   Modules accepted: Orders

## 2012-03-13 LAB — CULTURE, BETA STREP (GROUP B ONLY)

## 2012-03-17 ENCOUNTER — Encounter: Payer: Self-pay | Admitting: Advanced Practice Midwife

## 2012-03-17 ENCOUNTER — Ambulatory Visit (INDEPENDENT_AMBULATORY_CARE_PROVIDER_SITE_OTHER): Payer: Self-pay | Admitting: Advanced Practice Midwife

## 2012-03-17 VITALS — BP 100/66 | Temp 97.3°F | Wt 176.0 lb

## 2012-03-17 DIAGNOSIS — O359XX0 Maternal care for (suspected) fetal abnormality and damage, unspecified, not applicable or unspecified: Secondary | ICD-10-CM

## 2012-03-17 LAB — POCT URINALYSIS DIP (DEVICE)
Bilirubin Urine: NEGATIVE
Glucose, UA: NEGATIVE mg/dL
Ketones, ur: NEGATIVE mg/dL
Specific Gravity, Urine: 1.02 (ref 1.005–1.030)

## 2012-03-17 NOTE — Patient Instructions (Addendum)
Trabajo de parto y parto normal (Normal Labor and Delivery) En primer lugar, su mdico debe estar seguro de que usted est en trabajo de parto. Algunos signos son:  Puede haber eliminado el "tapn mucoso" antes que comience el trabajo de parto. Se trata de una pequea cantidad de mucus con sangre.  Tiene contracciones uterinas regulares.  El tiempo entre las contracciones se acorta.  Las molestias y el dolor se hacen gradualmente ms intensos.  El dolor se ubica principalmente en la espalda.  Los dolores empeoran al caminar.  El cuello del tero (la apertura del tero se hace ms delgada, comienza a borrarse, y se abre (se dilata). Una vez que se encuentre en trabajo de parto y sea admitida en el hospital, el mdico har lo siguiente:  Un examen fsico completo.  Controlar sus signos vitales (presin arterial, pulso, temperatura y la frecuencia cardaca fetal).  Realizar un examen vaginal (usando un guante estril y lubricante para determinar:  La posicin (presentacin) del beb (ceflica [vertex] o nalgas primero).  El nivel (plano) de la cabeza del beb en el canal de parto.  El borramiento y dilatacin del cuello del tero.  Le rasurarn el vello pbico y le aplicarn una enema segn lo considere el mdico y las circunstancias.  Generalmente se coloca un monitor electrnico sobre el abdomen. El monitor sigue la duracin e intensidad de las contracciones, as como la frecuencia cardaca del beb.  Generalmente, el profesional inserta una va intravenosa en el brazo para administrarle agua azucarada. Esta es una medida de precaucin, de modo que puedan administrarle rpidamente medicamentos durante el trabajo de parto. EL TRABAJO DE PARTO Y PARTO NORMALES SE DIVIDEN EN 3 ETAPAS: Primera etapa Comienzan las contracciones regulares y el cuello comienza a borrarse y dilatarse. Esta etapa puede durar entre 3 y 15 horas. El final de la primera etapa se considera cuando el cuello  est borrado en un 100% y se ha dilatado 10 cm. Le administrarn analgsicos por:  Inyeccin (morfina, demerol, etc.).  Anestesia regional (espinal, caudal o epidural, anestsicos colocados en diferentes regiones de la columna vertebral). Podrn administrarle medicamentos para el dolor en la regin paracervical, que consiste en la aplicacin de un anestsico inyectable en cada uno de los lados del cuello del tero. La embarazada puede requerir un "parto natural" , es decir no recibir medicamentos o anestesia durante el trabajo de parto y el parto. Segunda etapa En este momento el beb baja a travs del canal de parto (vagina) y nace. Esto puede durar entre 1 y 4 horas. A medida que el beb asoma la cabeza por el canal de parto, podr sentir una sensacin similar a cuando mueve el intestino. Sentir el impulse de empujar con fuerza hasta que el nio salga. A medida que la cabecita baja, el mdico decidir si realiza una episiotoma (corte en el perineo y rea de la vagina) para evitar la ruptura de los tejidos). Luego del nacimiento del beb y la expulsin de la placenta, la episiotoma se sutura. En algunos casos se coloca a la madre una mscara con xido nitroso para facilitar la respiracin y aliviar el dolor. El final de la etapa 2 se produce cuando el beb ha salido completamente. Luego, cuando el cordn umbilical deja de pulsar, se pinza y se corta. Tercera etapa La tercera etapa comienza luego que el beb ha nacido y finaliza luego de la expulsin de la placenta. Generalmente esto lleva entre 5 y 30 minutos. Luego de la expulsin de la   placenta, le aplicarn un medicamento por va intravenosa para ayudar a contraer el tero y prevenir hemorragias. En la tercera etapa no hay dolor y generalmente no son necesarios los analgsicos. Si le han realizado una episiotoma, es el momento de repararla. Luego del parto, la mam es observada y controlada exhaustivamente durante 1  2 horas para verificar que no  hay sangrado en el post parto (hemorragias). Si pierde mucha sangre, le administrarn un medicamento para contraer el tero y detener la hemorragia. Document Released: 03/06/2008 Document Revised: 06/16/2011 [employer] Patient Information 2013 [employer], [employer].  

## 2012-03-17 NOTE — Progress Notes (Signed)
Doing well. Has pressure and some low back pain. Reviewed signs of labor

## 2012-03-17 NOTE — Progress Notes (Signed)
Pulse 84 Edema trace in feet. C/o lower back pain; no pressure.

## 2012-03-24 ENCOUNTER — Encounter: Payer: Self-pay | Admitting: Advanced Practice Midwife

## 2012-03-24 ENCOUNTER — Ambulatory Visit (INDEPENDENT_AMBULATORY_CARE_PROVIDER_SITE_OTHER): Payer: Self-pay | Admitting: Advanced Practice Midwife

## 2012-03-24 VITALS — BP 109/68 | Temp 96.9°F | Wt 177.6 lb

## 2012-03-24 DIAGNOSIS — E669 Obesity, unspecified: Secondary | ICD-10-CM

## 2012-03-24 DIAGNOSIS — O99210 Obesity complicating pregnancy, unspecified trimester: Secondary | ICD-10-CM

## 2012-03-24 DIAGNOSIS — O0930 Supervision of pregnancy with insufficient antenatal care, unspecified trimester: Secondary | ICD-10-CM

## 2012-03-24 DIAGNOSIS — O368190 Decreased fetal movements, unspecified trimester, not applicable or unspecified: Secondary | ICD-10-CM

## 2012-03-24 DIAGNOSIS — O359XX0 Maternal care for (suspected) fetal abnormality and damage, unspecified, not applicable or unspecified: Secondary | ICD-10-CM

## 2012-03-24 LAB — POCT URINALYSIS DIP (DEVICE)
Bilirubin Urine: NEGATIVE
Glucose, UA: NEGATIVE mg/dL
Hgb urine dipstick: NEGATIVE
Ketones, ur: NEGATIVE mg/dL
Specific Gravity, Urine: 1.015 (ref 1.005–1.030)
Urobilinogen, UA: 0.2 mg/dL (ref 0.0–1.0)

## 2012-03-24 NOTE — Progress Notes (Signed)
Reports decreased FM. Will check NST.  Discussed signs of labor.  >> NST reactive

## 2012-03-24 NOTE — Progress Notes (Signed)
Pt felt good FM during NST- discussed normal patterns of FM for this EGA.  Pt to return to hospital for new decreases in FM or sx of labor

## 2012-03-24 NOTE — Patient Instructions (Addendum)
Trabajo de parto y parto normal (Normal Labor and Delivery) En primer lugar, su mdico debe estar seguro de que usted est en trabajo de parto. Algunos signos son:  Puede haber eliminado el "tapn mucoso" antes que comience el trabajo de parto. Se trata de una pequea cantidad de mucus con sangre.  Tiene contracciones uterinas regulares.  El tiempo entre las contracciones se acorta.  Las molestias y el dolor se hacen gradualmente ms intensos.  El dolor se ubica principalmente en la espalda.  Los dolores empeoran al caminar.  El cuello del tero (la apertura del tero se hace ms delgada, comienza a borrarse, y se abre (se dilata). Una vez que se encuentre en trabajo de parto y sea admitida en el hospital, el mdico har lo siguiente:  Un examen fsico completo.  Controlar sus signos vitales (presin arterial, pulso, temperatura y la frecuencia cardaca fetal).  Realizar un examen vaginal (usando un guante estril y lubricante para determinar:  La posicin (presentacin) del beb (ceflica [vertex] o nalgas primero).  El nivel (plano) de la cabeza del beb en el canal de parto.  El borramiento y dilatacin del cuello del tero.  Le rasurarn el vello pbico y le aplicarn una enema segn lo considere el mdico y las circunstancias.  Generalmente se coloca un monitor electrnico sobre el abdomen. El monitor sigue la duracin e intensidad de las contracciones, as como la frecuencia cardaca del beb.  Generalmente, el profesional inserta una va intravenosa en el brazo para administrarle agua azucarada. Esta es una medida de precaucin, de modo que puedan administrarle rpidamente medicamentos durante el trabajo de parto. EL TRABAJO DE PARTO Y PARTO NORMALES SE DIVIDEN EN 3 ETAPAS: Primera etapa Comienzan las contracciones regulares y el cuello comienza a borrarse y dilatarse. Esta etapa puede durar entre 3 y 15 horas. El final de la primera etapa se considera cuando el cuello  est borrado en un 100% y se ha dilatado 10 cm. Le administrarn analgsicos por:  Inyeccin (morfina, demerol, etc.).  Anestesia regional (espinal, caudal o epidural, anestsicos colocados en diferentes regiones de la columna vertebral). Podrn administrarle medicamentos para el dolor en la regin paracervical, que consiste en la aplicacin de un anestsico inyectable en cada uno de los lados del cuello del tero. La embarazada puede requerir un "parto natural" , es decir no recibir medicamentos o anestesia durante el trabajo de parto y el parto. Segunda etapa En este momento el beb baja a travs del canal de parto (vagina) y nace. Esto puede durar entre 1 y 4 horas. A medida que el beb asoma la cabeza por el canal de parto, podr sentir una sensacin similar a cuando mueve el intestino. Sentir el impulse de empujar con fuerza hasta que el nio salga. A medida que la cabecita baja, el mdico decidir si realiza una episiotoma (corte en el perineo y rea de la vagina) para evitar la ruptura de los tejidos). Luego del nacimiento del beb y la expulsin de la placenta, la episiotoma se sutura. En algunos casos se coloca a la madre una mscara con xido nitroso para facilitar la respiracin y aliviar el dolor. El final de la etapa 2 se produce cuando el beb ha salido completamente. Luego, cuando el cordn umbilical deja de pulsar, se pinza y se corta. Tercera etapa La tercera etapa comienza luego que el beb ha nacido y finaliza luego de la expulsin de la placenta. Generalmente esto lleva entre 5 y 30 minutos. Luego de la expulsin de la   placenta, le aplicarn un medicamento por va intravenosa para ayudar a contraer el tero y prevenir hemorragias. En la tercera etapa no hay dolor y generalmente no son necesarios los analgsicos. Si le han realizado una episiotoma, es el momento de repararla. Luego del parto, la mam es observada y controlada exhaustivamente durante 1  2 horas para verificar que no  hay sangrado en el post parto (hemorragias). Si pierde mucha sangre, le administrarn un medicamento para contraer el tero y detener la hemorragia. Document Released: 03/06/2008 Document Revised: 06/16/2011 [employer] Patient Information 2013 [employer], [employer].  

## 2012-03-24 NOTE — Progress Notes (Signed)
Pulse 78  Patient reports she feels like baby is moving less and reports some occasional contractions

## 2012-03-26 ENCOUNTER — Inpatient Hospital Stay (HOSPITAL_COMMUNITY): Payer: Medicaid Other | Admitting: Anesthesiology

## 2012-03-26 ENCOUNTER — Encounter (HOSPITAL_COMMUNITY): Payer: Self-pay | Admitting: Anesthesiology

## 2012-03-26 ENCOUNTER — Inpatient Hospital Stay (HOSPITAL_COMMUNITY)
Admission: AD | Admit: 2012-03-26 | Discharge: 2012-03-28 | DRG: 775 | Disposition: A | Discharge status: Home / Self Care | Payer: Medicaid Other | Source: Ambulatory Visit | Attending: Obstetrics & Gynecology | Admitting: Obstetrics & Gynecology

## 2012-03-26 ENCOUNTER — Encounter (HOSPITAL_COMMUNITY): Payer: Self-pay | Admitting: *Deleted

## 2012-03-26 DIAGNOSIS — O09529 Supervision of elderly multigravida, unspecified trimester: Secondary | ICD-10-CM | POA: Diagnosis present

## 2012-03-26 DIAGNOSIS — O4290 Premature rupture of membranes, unspecified as to length of time between rupture and onset of labor, unspecified weeks of gestation: Principal | ICD-10-CM | POA: Diagnosis present

## 2012-03-26 DIAGNOSIS — O359XX0 Maternal care for (suspected) fetal abnormality and damage, unspecified, not applicable or unspecified: Secondary | ICD-10-CM

## 2012-03-26 DIAGNOSIS — O0930 Supervision of pregnancy with insufficient antenatal care, unspecified trimester: Secondary | ICD-10-CM

## 2012-03-26 DIAGNOSIS — Z3490 Encounter for supervision of normal pregnancy, unspecified, unspecified trimester: Secondary | ICD-10-CM

## 2012-03-26 LAB — CBC
Hemoglobin: 11.9 g/dL — ABNORMAL LOW (ref 12.0–15.0)
MCH: 30.7 pg (ref 26.0–34.0)
MCV: 90.2 fL (ref 78.0–100.0)
Platelets: 224 10*3/uL (ref 150–400)
RBC: 3.88 MIL/uL (ref 3.87–5.11)
WBC: 11.2 10*3/uL — ABNORMAL HIGH (ref 4.0–10.5)

## 2012-03-26 LAB — TYPE AND SCREEN
ABO/RH(D): O POS
Antibody Screen: NEGATIVE

## 2012-03-26 LAB — ABO/RH: ABO/RH(D): O POS

## 2012-03-26 MED ORDER — IBUPROFEN 600 MG PO TABS: 600.0000 mg | ORAL_TABLET | Freq: Four times a day (QID) | ORAL | Status: DC | PRN

## 2012-03-26 MED ORDER — ONDANSETRON HCL 4 MG PO TABS: 4.0000 mg | ORAL_TABLET | ORAL | Status: DC | PRN

## 2012-03-26 MED ORDER — LIDOCAINE HCL (PF) 1 % IJ SOLN
30.0000 mL | INTRAMUSCULAR | Status: DC | PRN
  Filled 2012-03-26: qty 30

## 2012-03-26 MED ORDER — ONDANSETRON HCL 4 MG/2ML IJ SOLN: 4.0000 mg | Freq: Four times a day (QID) | INTRAMUSCULAR | Status: DC | PRN

## 2012-03-26 MED ORDER — ZOLPIDEM TARTRATE 5 MG PO TABS: 5.0000 mg | ORAL_TABLET | Freq: Every evening | ORAL | Status: DC | PRN

## 2012-03-26 MED ORDER — SIMETHICONE 80 MG PO CHEW: 80.0000 mg | CHEWABLE_TABLET | ORAL | Status: DC | PRN

## 2012-03-26 MED ORDER — ONDANSETRON HCL 4 MG/2ML IJ SOLN: 4.0000 mg | INTRAMUSCULAR | Status: DC | PRN

## 2012-03-26 MED ORDER — LANOLIN HYDROUS EX OINT: TOPICAL_OINTMENT | CUTANEOUS | Status: DC | PRN

## 2012-03-26 MED ORDER — LACTATED RINGERS IV SOLN: INTRAVENOUS | Status: DC

## 2012-03-26 MED ORDER — PHENYLEPHRINE 40 MCG/ML (10ML) SYRINGE FOR IV PUSH (FOR BLOOD PRESSURE SUPPORT)
80.0000 ug | PREFILLED_SYRINGE | INTRAVENOUS | Status: DC | PRN
  Filled 2012-03-26: qty 5

## 2012-03-26 MED ORDER — PRENATAL MULTIVITAMIN CH
1.0000 | ORAL_TABLET | Freq: Every day | ORAL | Status: DC
  Administered 2012-03-27 – 2012-03-28 (×2): 1 via ORAL
  Filled 2012-03-26: qty 1

## 2012-03-26 MED ORDER — TETANUS-DIPHTH-ACELL PERTUSSIS 5-2.5-18.5 LF-MCG/0.5 IM SUSP
0.5000 mL | Freq: Once | INTRAMUSCULAR | Status: AC
  Administered 2012-03-28: 0.5 mL via INTRAMUSCULAR

## 2012-03-26 MED ORDER — ACETAMINOPHEN 325 MG PO TABS: 650.0000 mg | ORAL_TABLET | ORAL | Status: DC | PRN

## 2012-03-26 MED ORDER — LACTATED RINGERS IV SOLN
500.0000 mL | INTRAVENOUS | Status: DC | PRN
  Administered 2012-03-26 (×2): 500 mL via INTRAVENOUS

## 2012-03-26 MED ORDER — OXYCODONE-ACETAMINOPHEN 5-325 MG PO TABS
1.0000 | ORAL_TABLET | ORAL | Status: DC | PRN
  Administered 2012-03-27 – 2012-03-28 (×5): 1 via ORAL
  Filled 2012-03-26 (×4): qty 1

## 2012-03-26 MED ORDER — OXYTOCIN 40 UNITS IN LACTATED RINGERS INFUSION - SIMPLE MED
1.0000 m[IU]/min | INTRAVENOUS | Status: DC
  Administered 2012-03-26: 2 m[IU]/min via INTRAVENOUS
  Filled 2012-03-26: qty 1000

## 2012-03-26 MED ORDER — EPHEDRINE 5 MG/ML INJ
10.0000 mg | INTRAVENOUS | Status: DC | PRN
  Filled 2012-03-26 (×2): qty 4

## 2012-03-26 MED ORDER — WITCH HAZEL-GLYCERIN EX PADS: 1.0000 "application " | MEDICATED_PAD | CUTANEOUS | Status: DC | PRN

## 2012-03-26 MED ORDER — LIDOCAINE HCL (PF) 1 % IJ SOLN
INTRAMUSCULAR | Status: DC | PRN
  Administered 2012-03-26 (×2): 5 mL

## 2012-03-26 MED ORDER — DIPHENHYDRAMINE HCL 25 MG PO CAPS: 25.0000 mg | ORAL_CAPSULE | Freq: Four times a day (QID) | ORAL | Status: DC | PRN

## 2012-03-26 MED ORDER — DIPHENHYDRAMINE HCL 50 MG/ML IJ SOLN: 12.5000 mg | INTRAMUSCULAR | Status: DC | PRN

## 2012-03-26 MED ORDER — BENZOCAINE-MENTHOL 20-0.5 % EX AERO: 1.0000 "application " | INHALATION_SPRAY | CUTANEOUS | Status: DC | PRN

## 2012-03-26 MED ORDER — SENNOSIDES-DOCUSATE SODIUM 8.6-50 MG PO TABS
2.0000 | ORAL_TABLET | Freq: Every day | ORAL | Status: DC
  Administered 2012-03-27: 2 via ORAL

## 2012-03-26 MED ORDER — FLEET ENEMA 7-19 GM/118ML RE ENEM: 1.0000 | ENEMA | RECTAL | Status: DC | PRN

## 2012-03-26 MED ORDER — EPHEDRINE 5 MG/ML INJ: 10.0000 mg | INTRAVENOUS | Status: DC | PRN

## 2012-03-26 MED ORDER — PHENYLEPHRINE 40 MCG/ML (10ML) SYRINGE FOR IV PUSH (FOR BLOOD PRESSURE SUPPORT): 80.0000 ug | PREFILLED_SYRINGE | INTRAVENOUS | Status: DC | PRN

## 2012-03-26 MED ORDER — OXYCODONE-ACETAMINOPHEN 5-325 MG PO TABS: 1.0000 | ORAL_TABLET | ORAL | Status: DC | PRN

## 2012-03-26 MED ORDER — TERBUTALINE SULFATE 1 MG/ML IJ SOLN: 0.2500 mg | Freq: Once | INTRAMUSCULAR | Status: DC | PRN

## 2012-03-26 MED ORDER — OXYTOCIN 40 UNITS IN LACTATED RINGERS INFUSION - SIMPLE MED: 62.5000 mL/h | INTRAVENOUS | Status: DC

## 2012-03-26 MED ORDER — FENTANYL 2.5 MCG/ML BUPIVACAINE 1/10 % EPIDURAL INFUSION (WH - ANES)
14.0000 mL/h | INTRAMUSCULAR | Status: DC
  Administered 2012-03-26: 14 mL/h via EPIDURAL
  Filled 2012-03-26: qty 125

## 2012-03-26 MED ORDER — LACTATED RINGERS IV SOLN: 500.0000 mL | Freq: Once | INTRAVENOUS | Status: DC

## 2012-03-26 MED ORDER — IBUPROFEN 600 MG PO TABS
600.0000 mg | ORAL_TABLET | Freq: Four times a day (QID) | ORAL | Status: DC
  Administered 2012-03-27 – 2012-03-28 (×6): 600 mg via ORAL
  Filled 2012-03-26 (×6): qty 1

## 2012-03-26 MED ORDER — CITRIC ACID-SODIUM CITRATE 334-500 MG/5ML PO SOLN: 30.0000 mL | ORAL | Status: DC | PRN

## 2012-03-26 MED ORDER — OXYTOCIN BOLUS FROM INFUSION: 500.0000 mL | INTRAVENOUS | Status: DC

## 2012-03-26 MED ORDER — DIBUCAINE 1 % RE OINT: 1.0000 "application " | TOPICAL_OINTMENT | RECTAL | Status: DC | PRN

## 2012-03-26 NOTE — Anesthesia Preprocedure Evaluation (Signed)

## 2012-03-26 NOTE — Progress Notes (Signed)
I have seen and examined this patient and I agree with the above. Cam Hai 8:28 PM 03/26/2012

## 2012-03-26 NOTE — MAU Note (Signed)
RDawson, RN, CNM student notified pt grossly ruptured, clear fluid, +fern slide from triage room, going to room 169 [employer].

## 2012-03-26 NOTE — Progress Notes (Signed)
S: Doing well, increasing pain with contractions.  Plans epidural when pain intolerable.  Clear fluid remains.  Good fetal movement.   OCeasar Mons Vitals:   03/26/12 1334 03/26/12 1401 03/26/12 1431 03/26/12 1501  BP: 110/65 109/58 104/68 110/69  Pulse: 79 81 86 81  Temp:      TempSrc:      Resp: 20 20 20 20      FHT:  FHR: 125 bpm, variability: moderate,  accelerations:  Present,  decelerations:  Absent UC:   irregular, every 2-8 minutes SVE:   Dilation: 3 Effacement (%): 80 Station: -1 Exam by:: m early rnc-ob   A / P: Induction of labor due to PROM at term Late Prenatal Care Favorable Cervix Pitocin Induction   Fetal Wellbeing:  Category I Pain Control:  Labor support without medications   Discussed epidural vs. IV pain medications       - Desires to have epidural only       - Opts to wait longer for epidural Expectant Management Anticipated MOD:  NSVD  Raelyn Mora, SNM 03/26/2012, 3:18 PM Supervised by: Philipp Deputy, CNM

## 2012-03-26 NOTE — Anesthesia Procedure Notes (Signed)

## 2012-03-26 NOTE — Progress Notes (Signed)
I have seen and examined this patient and I agree with the above. Ramy Greth 8:27 PM 03/26/2012    

## 2012-03-26 NOTE — H&P (Signed)
Mackenzie Bennett Veva Holes is a 36 y.o. female G3P2002 at 38 weeks 4 days based on LMP presenting for PROM clear fluid since 0630, no contractions. Maternal Medical History:  Reason for admission: Reason for admission: rupture of membranes.  Contractions: Onset was 6-12 hours ago.   Frequency: irregular.   Perceived severity is mild.    Fetal activity: Perceived fetal activity is normal.   Last perceived fetal movement was within the past hour.    Prenatal complications: Late to [facility] - 24.2 wks    OB History    Grav Para Term Preterm Abortions TAB SAB Ect Mult Living   3 2 2   0 0 0   2     Past Medical History  Diagnosis Date  . No pertinent past medical history    Past Surgical History  Procedure Date  . Eye surgery     both eyes, for pupil   Family History: family history includes Cancer in her maternal uncle and Hypertension in her mother. Social History:  reports that she has never smoked. She has never used smokeless tobacco. She reports that she does not drink alcohol or use illicit drugs.   Prenatal Transfer Tool  Maternal Diabetes: No Genetic Screening: Declined, late to care Maternal Ultrasounds/Referrals: Normal Fetal Ultrasounds or other Referrals:  Other: , re-scanned for LT ventricle size Maternal Substance Abuse:  No Significant Maternal Medications:  None Significant Maternal Lab Results:  None Other Comments:  None  Review of Systems  Constitutional: Negative.   HENT: Negative.   Eyes: Negative.   Respiratory: Negative.   Cardiovascular: Negative.   Gastrointestinal: Negative.   Genitourinary: Negative.   Musculoskeletal: Negative.   Skin: Negative.   Neurological: Negative.   Endo/Heme/Allergies: Negative.   Psychiatric/Behavioral: Negative.     Dilation: 3 Effacement (%): 80 Station: -1 Exam by:: rolita dawson snm Blood pressure 88/68, pulse 92, temperature 99.2 F (37.3 C), temperature source Oral, resp. rate 20, last menstrual period  06/30/2011. Maternal Exam:  Uterine Assessment: Contraction strength is mild.  Contraction duration is 30 seconds. Contraction frequency is irregular.   Abdomen: Patient reports no abdominal tenderness. Fetal presentation: vertex  Introitus: Normal vulva. Amniotic fluid character: clear.  Pelvis: adequate for delivery.   Cervix: Cervix evaluated by digital exam.     Fetal Exam Fetal Monitor Review: Mode: ultrasound.   Baseline rate: 135.  Variability: moderate (6-25 bpm).   Pattern: accelerations present and no decelerations.    Fetal State Assessment: Category I - tracings are normal.     Physical Exam  Constitutional: She is oriented to person, place, and time. She appears well-developed and well-nourished.  HENT:  Head: Normocephalic and atraumatic.  Right Ear: External ear normal.  Left Ear: External ear normal.  Nose: Nose normal.  Mouth/Throat: Oropharynx is clear and moist.  Eyes: Conjunctivae normal and EOM are normal. Pupils are equal, round, and reactive to light.  Neck: Normal range of motion. Neck supple.  Cardiovascular: Normal rate, regular rhythm, normal heart sounds and intact distal pulses.   Respiratory: Effort normal and breath sounds normal.  GI: Soft. Bowel sounds are normal.  Genitourinary: Vagina normal and uterus normal.       Gravid  Musculoskeletal: Normal range of motion.  Neurological: She is alert and oriented to person, place, and time. She has normal reflexes.  Skin: Skin is warm and dry.  Psychiatric: She has a normal mood and affect. Her behavior is normal. Judgment and thought content normal.    Prenatal  labs: ABO, Rh: O/POS/-- (09/11 0937) Antibody: NEG (09/11 0937) Rubella: 220.2 (09/11 0937) RPR: NON REAC (09/11 0937)  HBsAg: NEGATIVE (09/11 0937)  HIV: NON REACTIVE (09/11 1610)  GBS: Negative (12/04 0000)   Assessment/Plan: 36 year old G3P2002, IUP @ 38.[redacted] weeks gestation PROM at term, no contractions Late Prenatal  Care Favorable cervix  Admit to L&D  Routine L&D orders  Start Pitocin 2 mU, increase by 2 mU   Raelyn Mora, SNM 03/26/2012, 12:34 PM Supervised by: Philipp Deputy, CNM  I have seen and examined this patient and I agree with the above. Cam Hai 2:37 PM 03/26/2012

## 2012-03-26 NOTE — Progress Notes (Signed)
I have seen and examined this patient and I agree with the above. SHAW, KIMBERLY 8:27 PM 03/26/2012    

## 2012-03-26 NOTE — Progress Notes (Signed)
S: Doing well, pain increased requiring an epidural bolus.  Pain well-controlled now after bolus.  Continues to leak clear fluid.  Reports some bloody show now.  Good fetal movement.   O: Filed Vitals:   03/26/12 1740 03/26/12 1800 03/26/12 1830 03/26/12 1901  BP: 108/59 111/67 116/67 112/73  Pulse: 77 69 75 76  Temp:   99.6 F (37.6 C)   TempSrc:   Oral   Resp: 20 20 20 20   Height:      Weight:         FHT:  FHR: 125 bpm, variability: minimal ,  accelerations:  Present,  decelerations:  Absent UC:   regular, every 2-3 minutes SVE:   Dilation: 5 Effacement (%): 100 Station: -1;-2 Exam by:: m early rnc-ob   A / P: Induction of labor due to PROM,  progressing well on pitocin Late Prenatal Care Favorable Cervix Pitocin Induction  Fetal Wellbeing:  Category I Pain Control:  Epidural Place Foley Catheter  Anticipated MOD:  NSVD  Raelyn Mora, SNM 03/26/2012, 7:28 PM Supervised by: Philipp Deputy, CNM

## 2012-03-26 NOTE — Progress Notes (Signed)
S: Doing well resting off and on.  Comfortable with epidural.  Leaking clear fluid.  Good fetal movement.   OCeasar Mons Vitals:   03/26/12 1725 03/26/12 1730 03/26/12 1736 03/26/12 1740  BP: 111/59 109/65 115/60 108/59  Pulse: 82 77 84 77  Temp:      TempSrc:      Resp: 20 20 20 20      FHT:  FHR: 130 bpm, variability: moderate,  accelerations:  Present,  decelerations:  Absent UC:   regular, every 3 minutes SVE:   Dilation: 5 Effacement (%): 90 Station: -1;-2 Exam by:: m early rnc-ob   A / P: Induction of labor due to PROM,  progressing well on pitocin Late Prenatal Care Favorable Cervix Pitocin Induction  Fetal Wellbeing:  Category I Pain Control:  Epidural  Anticipated MOD:  NSVD  Raelyn Mora, SNM 03/26/2012, 5:56 PM Supervised by: Philipp Deputy, CNM

## 2012-03-26 NOTE — Progress Notes (Signed)
Spanish Interpreter at bedside for delivery per pt request.

## 2012-03-27 LAB — CBC
HCT: 31.1 % — ABNORMAL LOW (ref 36.0–46.0)
MCV: 89.4 fL (ref 78.0–100.0)
RBC: 3.48 MIL/uL — ABNORMAL LOW (ref 3.87–5.11)
WBC: 14.6 10*3/uL — ABNORMAL HIGH (ref 4.0–10.5)

## 2012-03-27 NOTE — Progress Notes (Signed)
I have seen and examined this patient and I agree with the above. Cam Hai 9:33 AM 03/27/2012

## 2012-03-27 NOTE — Progress Notes (Signed)
Post Partum Day 1 Subjective: no complaints, up ad lib, voiding, tolerating PO and + flatus  Objective: Blood pressure 95/58, pulse 98, temperature 98.2 F (36.8 C), temperature source Oral, resp. rate 18, height 5' (1.524 m), weight 80.287 kg (177 lb), last menstrual period 06/30/2011, SpO2 98.00%, unknown if currently breastfeeding.  Physical Exam:  General: alert, cooperative and no distress Lochia: appropriate Uterine Fundus: firm DVT Evaluation: No evidence of DVT seen on physical exam.   Basename 03/27/12 0616 03/26/12 1230  HGB 10.6* 11.9*  HCT 31.1* 35.0*    Assessment/Plan: Plan for discharge tomorrow, Breastfeeding and Contraception Condoms   LOS: 1 day   Governor Specking 03/27/2012, 7:41 AM

## 2012-03-27 NOTE — Progress Notes (Deleted)
Post Partum Day 1 Subjective: no complaints, up ad lib, voiding, tolerating PO and + flatus  Objective: Blood pressure 95/58, pulse 98, temperature 98.2 F (36.8 C), temperature source Oral, resp. rate 18, height 5' (1.524 m), weight 80.287 kg (177 lb), last menstrual period 06/30/2011, SpO2 98.00%, unknown if currently breastfeeding.  Physical Exam:  General: alert, cooperative and no distress Lochia: appropriate Uterine Fundus: firm DVT Evaluation: No evidence of DVT seen on physical exam.   Basename 03/27/12 0616 03/26/12 1230  HGB 10.6* 11.9*  HCT 31.1* 35.0*    Assessment/Plan: Plan for discharge tomorrow, Breastfeeding and Contraception condoms   LOS: 1 day   Governor Specking 03/27/2012, 7:27 AM

## 2012-03-27 NOTE — Anesthesia Postprocedure Evaluation (Signed)
  Anesthesia Post-op Note  Patient: Mackenzie Bennett  Procedure(s) Performed: * No procedures listed *  Patient Location: PACU and Mother/Baby  Anesthesia Type:Epidural  Level of Consciousness: awake, alert  and oriented  Airway and Oxygen Therapy: Patient Spontanous Breathing    Post-op Assessment: Patient's Cardiovascular Status Stable and Respiratory Function Stable  Post-op Vital Signs: stable  Complications: No apparent anesthesia complications

## 2012-03-28 NOTE — Discharge Summary (Signed)
Obstetric Discharge Summary Reason for Admission: onset of labor Prenatal Procedures: ultrasound Intrapartum Procedures: spontaneous vaginal delivery Postpartum Procedures: none Complications-Operative and Postpartum: none Hemoglobin  Date Value Range Status  03/27/2012 10.6* 12.0 - 15.0 g/dL Final     HCT  Date Value Range Status  03/27/2012 31.1* 36.0 - 46.0 % Final    Physical Exam:  General: alert, cooperative and no distress Lochia: appropriate Uterine Fundus: firm Incision: n/a DVT Evaluation: No evidence of DVT seen on physical exam.  Discharge Diagnoses: Preterm vaginal delivery  Discharge Information: Date: 03/28/2012 Activity: pelvic rest Diet: routine Medications: None Condition: stable Instructions: refer to practice specific booklet Discharge to: home Follow-up Information    Follow up with [facility]. Schedule an appointment as soon as possible for a visit in 5 weeks.         Newborn Data: Live born female  Birth Weight: 6 lb 7.5 oz (2935 g) APGAR: 8, 9  Home with mother.  Mackenzie Bennett,Mackenzie Bennett 03/28/2012, 9:20 AM

## 2012-04-05 ENCOUNTER — Encounter: Payer: Self-pay | Admitting: Obstetrics and Gynecology

## 2012-04-29 ENCOUNTER — Ambulatory Visit: Payer: Self-pay | Admitting: Medical

## 2012-05-03 ENCOUNTER — Ambulatory Visit (INDEPENDENT_AMBULATORY_CARE_PROVIDER_SITE_OTHER): Payer: Medicaid Other | Admitting: Obstetrics & Gynecology

## 2012-05-03 ENCOUNTER — Encounter: Payer: Self-pay | Admitting: Obstetrics & Gynecology

## 2012-05-03 NOTE — Patient Instructions (Signed)
Mtodos anticonceptivos de Warehouse manager  (Contraceptive Barrier Methods) El mtodo de barrera es un tipo de mtodo de control de la natalidad (anticonceptivo) que se Nurse, mental health para Location manager. Estos mtodos son:   Condn masculino.  Condn femenino.  Diafragma.  Capuchn cervical  Esponja.  Espermicida. El mdico la ayudar a Chief Strategy Officer cul es el mejor anticonceptivo para usted. Debe tener siempre en cuenta los riesgos de las enfermedades de transmisin sexual (ETS).  CONDN MASCULINO  Es una vaina delgada (ltex o goma) que se [place] en el pene durante el acto sexual. El condn impide el embarazo al capturar e impedir que los espermatozoides lleguen al tero. Pueden contener un espermicida. No deben usarse con con vaselina, lociones ni aceites. Esto disminuye su efectividad. Pueden usarse con lubricantes a base de agua. Ayudan a Health visitor las ETS.  CONDN FEMENINO Los condones femeninos son una vaina blanda y floja que se coloca en la vagina antes de las relaciones sexuales. Impide el embarazo al capturar los espermatozoides e impedir que lleguen al tero. Pueden usarse slo una vez. El compaero no debe usar un condn al [employer]. Los condones masculino y femenino pueden adherirse entre s y romperse. El condn femenino puede insertarse hasta 8 horas antes de la relacin sexual. Timoteo Expose a proteger contra las ETS.  DIAFRAGMA  El diafragma es una barrera de ltex, blanda con forma de cpula, que se coloca en la vagina con un gel espermicida, antes de las [employer]. Cubre el cuello del tero, destruye los espermatozoides e impide el pasaje del semen al cuello. Debe dejar el diafragma colocado en la vagina durante 6 a 8 horas despus de la relacin sexual. Debe ser ajustado por el mdico. Este mtodo no protege contra las ETS.  CAPUCHN CERVICAL  El capuchn cervical es una copa redonda, [place], de ltex o plstico que se coloca en la vagina y se ajusta sobre el cuello del  tero. Se coloca hasta 8 horas antes de la actividad sexual y puede dejarse en el lugar hasta 48 horas. Proporciona proteccin continua mientras se encuentra colocado, independientemente del nmero de relaciones sexuales que tenga. Debe ser ajustado por el mdico. El capuchn cervical no protege contra las ETS.  ESPONJA  Es una pieza circular de espuma de poliuretano que contiene un espermicida. Tiene un asa para que pueda ser retirada. Se inserta en la vagina despus de mojarla y se coloca sobre el cuello del tero antes de las [address] La espuma ha sido diseada para retener y Loss adjuster, chartered esperma antes de que ingrese en el cuello del tero. El espermicida destruye o inmoviliza los espermatozoides. Libera el espermicida de manera inmediata y continua durante un perodo de 24 horas independientemente del nmero de Clinical research associate. Debe dejar colocada la esponja en la vagina durante 6 horas despus de las [employer]. No debe dejarla colocada durante ms de 24 horas. Este mtodo no protege contra las ETS.  ESPERMICIDAS  Los espermicidas son sustancias qumicas que destruyen o bloquean el esperma y no lo dejan ingresar al cuello del tero y al tero. Vienen en forma de cremas, geles, supositorios, espuma, film o comprimidos. El film, las tabletas y los supositorios deben insertarse entre 10 y 30 minutos antes de la relacin sexual, de modo que puedan [place]. Se insertan en la vagina con un aplicador antes de Management consultant. Esto debe repetirse cada vez que tiene [employer]. El uso de espermicidas no protege contra las ETS.  Document Released: 09/10/2007 Document  Revised: 06/16/2011 ExitCare Patient Information 2013 [place], [place].

## 2012-05-03 NOTE — Progress Notes (Unsigned)
Patient ID: Mackenzie Bennett, female   DOB: [birth year], 37 y.o.   MRN: [id number] Subjective:     Mackenzie Bennett is a 37 y.o. female who presents for a postpartum visit. She is 5 weeks postpartum following a spontaneous vaginal delivery. I have fully reviewed the prenatal and intrapartum course. The delivery was at 39 gestational weeks. Outcome: spontaneous vaginal delivery. Anesthesia: none. Postpartum course has been normal. Baby's course has been good. Baby is feeding by breast. Bleeding staining only. Bowel function is normal. Bladder function is normal. Patient is sexually active. Contraception method is condoms. Postpartum depression screening: negative.  The following portions of the patient's history were reviewed and updated as appropriate: allergies, current medications, past family history, past medical history, past social history, past surgical history and problem list.  Review of Systems Pertinent items are noted in HPI.   Objective:    BP 108/75  Pulse 74  Temp 97.4 F (36.3 C) (Oral)  Ht 5' (1.524 m)  Wt 160 lb 14.4 oz (72.984 kg)  BMI 31.42 kg/m2  Breastfeeding? Yes  General:  alert, cooperative and no distress           Abdomen: soft, non-tender; bowel sounds normal; no masses,  no organomegaly   Vulva:  normal  Vagina: normal vagina  Cervix:  no lesions  Corpus: normal  Adnexa:  normal adnexa  Rectal Exam: Not performed.        Assessment:    Normal postpartum exam. Pap smear not done at today's visit.   Plan:    1. Contraception: condoms 2. Yearly exams, pap in 5 years Neg HR HPV 3. Follow up in: 8 months or as needed.   Caylan Chenard 2:24 PM 05/03/2012

## 2013-12-04 IMAGING — US US OB FOLLOW-UP
1 series · 12 of 28 positions shown · non-contrast
Comparison: none

[Series 1: us ob follow up · 46 acquisitions, 12 frames shown]
[im 2/46]
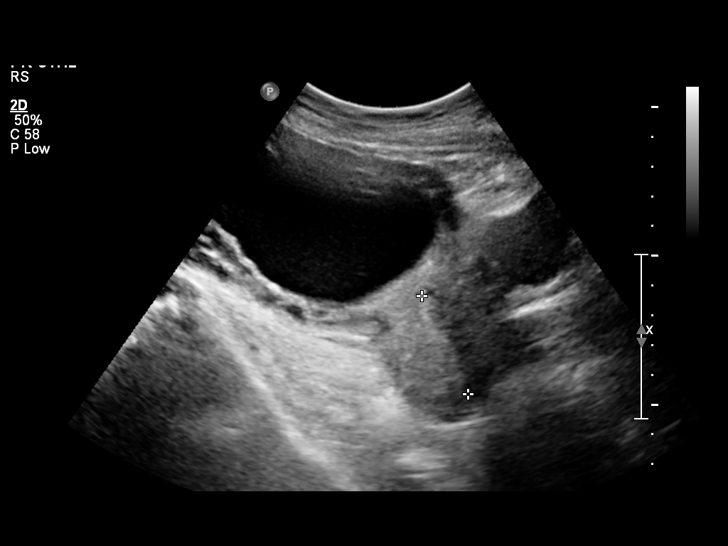
[im 6/46]
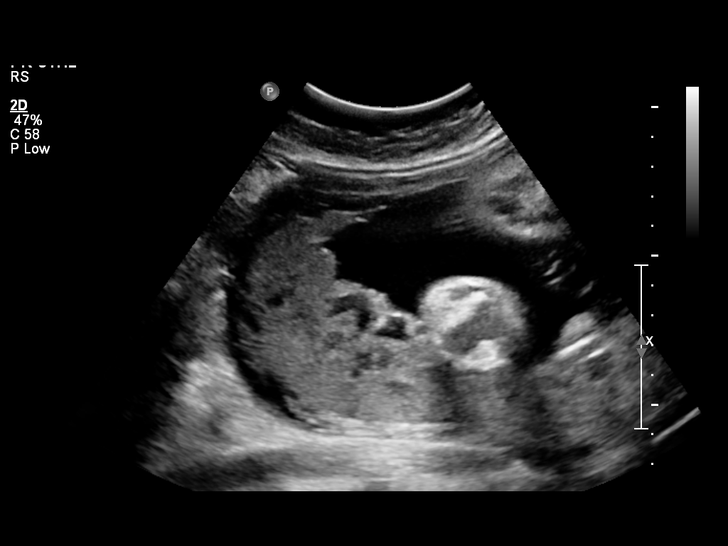
[im 9/46]
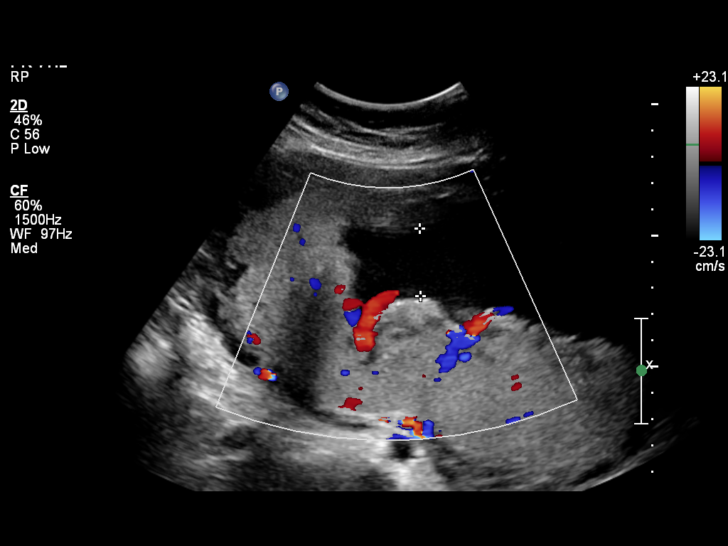
[im 14/46]
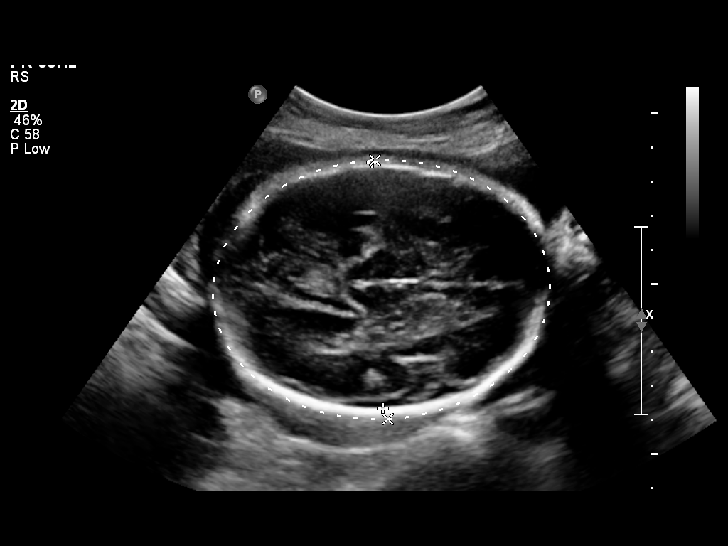
[im 17/46]
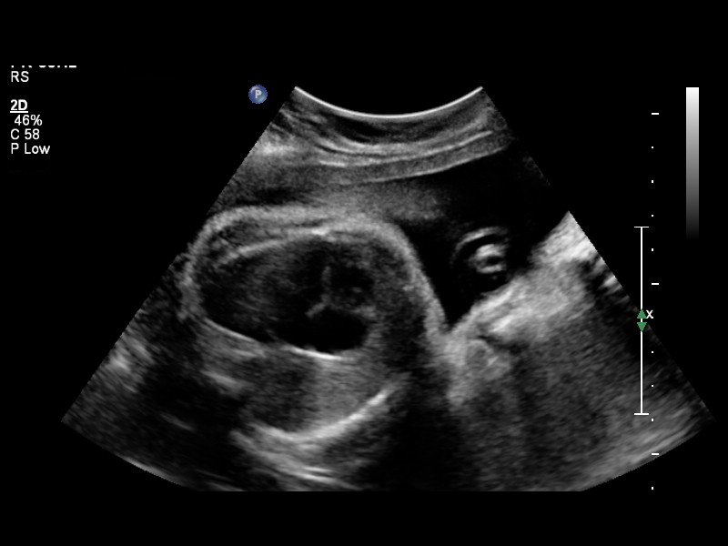
[im 21/46]
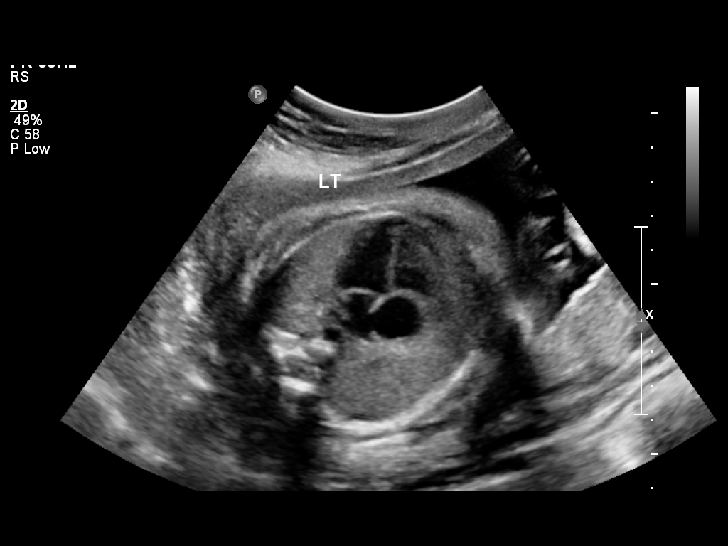
[im 26/46]
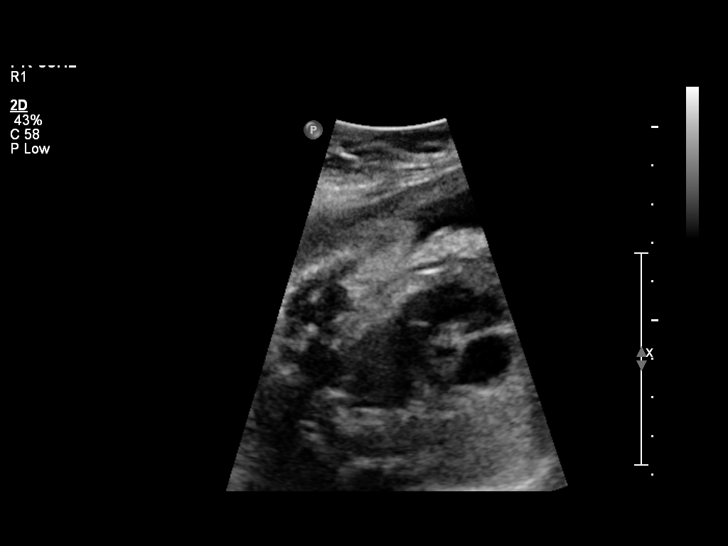
[im 29/46]
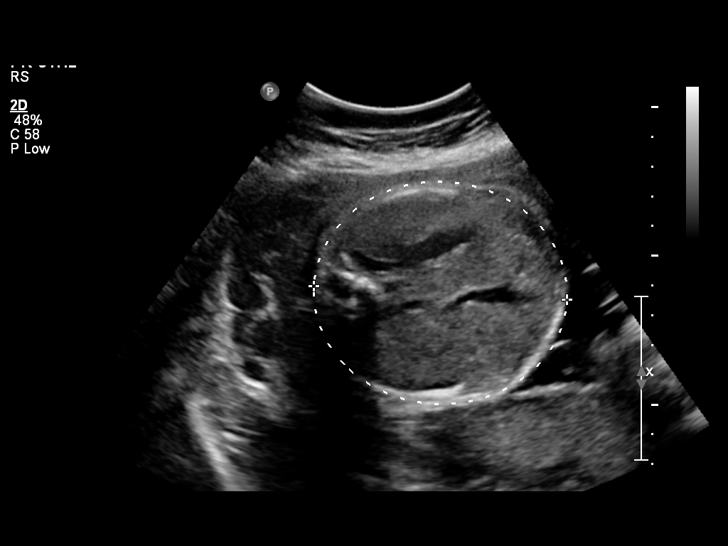
[im 32/46]
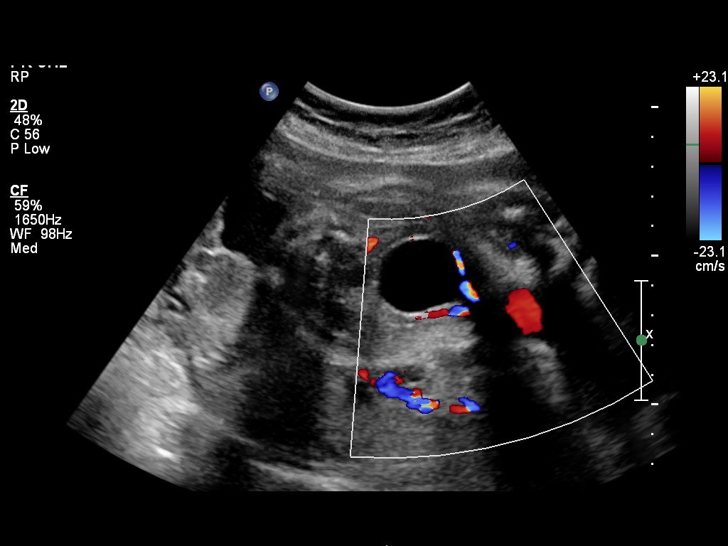
[im 37/46]
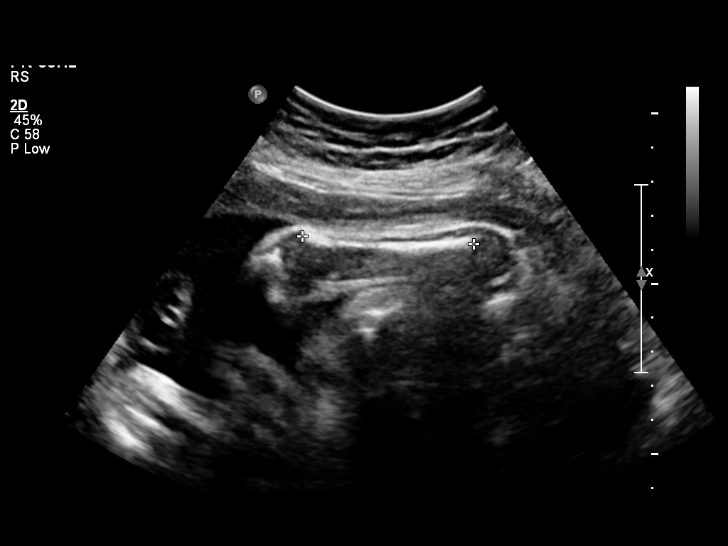
[im 41/46]
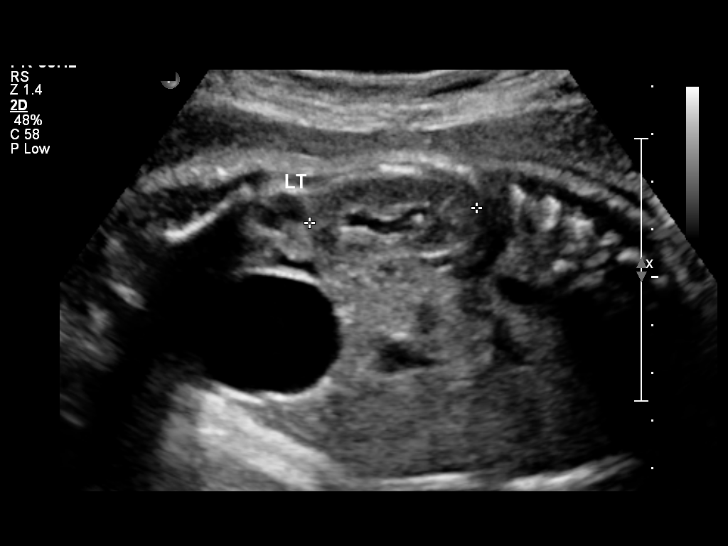
[im 44/46]
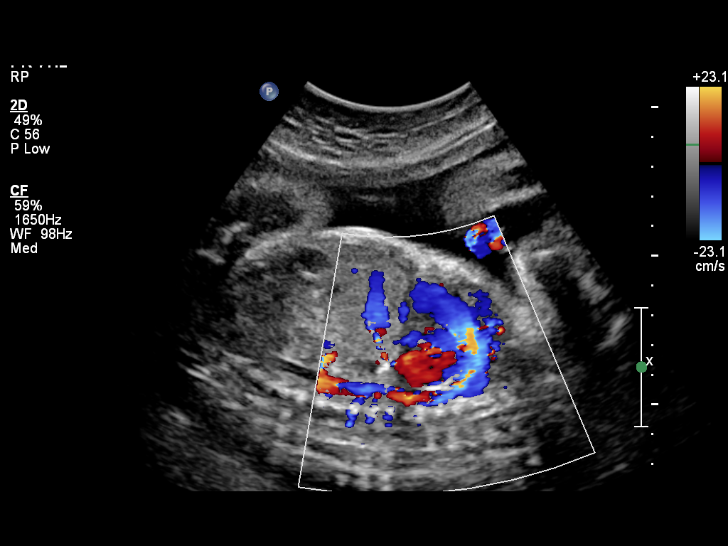

[12 of 28 positions shown; findings below may reference images not displayed]

OBSTETRICS REPORT
                      (Signed Final 01/21/2012 [DATE])

             BAMYAN

Service(s) Provided

 US OB FOLLOW UP                                       76816.1
Indications

 No or Little Prenatal Care
 Advanced maternal age (AMA), Multigravida
 F/u detailed fetal anatomic survey (complete
 anatomy)
Fetal Evaluation

 Num Of Fetuses:    1
 Fetal Heart Rate:  127                         bpm
 Cardiac Activity:  Observed
 Presentation:      Cephalic
 Placenta:          Posterior, above cervical
                    os
 P. Cord            Previously Visualized
 Insertion:

 Amniotic Fluid
 AFI FV:      Subjectively within normal limits
 AFI Sum:     10.86   cm      19   %Tile     Larg Pckt:     3.7  cm
 RUQ:   2.56   cm    RLQ:    2.9    cm    LUQ:   3.7     cm   LLQ:    1.7    cm
Biometry

 BPD:     72.8  mm    G. Age:   29w 2d                CI:        69.86   70 - 86
                                                      FL/HC:      20.2   19.6 -

 HC:     277.9  mm    G. Age:   30w 3d       49  %    HC/AC:      1.09   0.99 -

 AC:     254.3  mm    G. Age:   29w 4d       54  %    FL/BPD:     77.1   71 - 87
 FL:      56.1  mm    G. Age:   29w 4d       42  %    FL/AC:      22.1   20 - 24
 HUM:     49.1  mm    G. Age:   29w 0d       37  %

 Est. FW:    1421  gm      3 lb 2 oz     58  %
Gestational Age

 LMP:           29w 2d       Date:   06/30/11                 EDD:   04/05/12
 U/S Today:     29w 5d                                        EDD:   04/02/12
 Best:          29w 2d    Det. By:   LMP  (06/30/11)          EDD:   04/05/12
Anatomy

 Cranium:          Appears normal         LVOT:             Appears normal
 Fetal Cavum:      Appears normal         Aortic Arch:      Appears normal
 Ventricles:       Appears normal         Ductal Arch:      Not well visualized
 Choroid Plexus:   Appears normal         Diaphragm:        Appears normal
 Cerebellum:       Previously seen        Stomach:          Appears normal, left
                                                            sided
 Posterior Fossa:  Previously seen        Abdomen:          Previously seen
 Nuchal Fold:      Not applicable (>20    Abdominal Wall:   Previously seen
                   wks GA)
 Face:             Orbits and profile     Cord Vessels:     Previously seen
                   previously seen
 Lips:             Previously seen        Kidneys:          Appear normal
 Palate:           Appears normal         Bladder:          Appears normal
 Heart:            Appears normal (4      Spine:            Previously seen
                   chamber & axis)
 RVOT:             Appears normal         Limbs:            Appears normal
                                                            (hands, ankles, feet)

 Other:  Fetus appears to be a female. Heels and 5th digit previously seen.
Cervix Uterus Adnexa

 Cervical Length:   3.6       cm

 Cervix:       Normal appearance by transabdominal scan.
 Uterus:       No abnormality visualized.

 Left Ovary:   No adnexal mass visualized.
 Right Ovary:  No adnexal mass visualized.
 Adnexa:     No abnormality visualized.
Impression

 Assigned GA is currently 29w 2d.   Appropriate fetal growth,
 with EFW at 58 %ile.
 No late developing fetal abnormalities seen involving
 visualized anatomy.  No cerebral ventriculomegaly on today's
 exam.
 Amniotic fluid within normal limits, with AFI of 10.86 cm.
 Normal cervical length.

 CALONAH SALEPITO with us.  Please do not hesitate to

## 2014-02-06 ENCOUNTER — Encounter: Payer: Self-pay | Admitting: Obstetrics & Gynecology

## 2014-08-24 ENCOUNTER — Ambulatory Visit (INDEPENDENT_AMBULATORY_CARE_PROVIDER_SITE_OTHER): Payer: Self-pay | Admitting: Emergency Medicine

## 2014-08-24 VITALS — BP 116/64 | HR 76 | Temp 98.8°F | Resp 16 | Ht 62.0 in | Wt 163.4 lb

## 2014-08-24 DIAGNOSIS — O926 Galactorrhea: Secondary | ICD-10-CM

## 2014-08-24 DIAGNOSIS — N643 Galactorrhea not associated with childbirth: Secondary | ICD-10-CM

## 2014-08-24 LAB — POCT CBC
GRANULOCYTE PERCENT: 67.8 % (ref 37–80)
HEMATOCRIT: 39.2 % (ref 37.7–47.9)
Hemoglobin: 13.2 g/dL (ref 12.2–16.2)
Lymph, poc: 1.7 (ref 0.6–3.4)
MCH, POC: 28.4 pg (ref 27–31.2)
MCHC: 33.6 g/dL (ref 31.8–35.4)
MCV: 84.6 fL (ref 80–97)
MID (CBC): 0.5 (ref 0–0.9)
MPV: 9.5 fL (ref 0–99.8)
POC GRANULOCYTE: 4.8 (ref 2–6.9)
POC LYMPH PERCENT: 24.5 %L (ref 10–50)
POC MID %: 7.7 %M (ref 0–12)
Platelet Count, POC: 271 10*3/uL (ref 142–424)
RBC: 4.63 M/uL (ref 4.04–5.48)
RDW, POC: 13.8 %
WBC: 7.1 10*3/uL (ref 4.6–10.2)

## 2014-08-24 NOTE — Patient Instructions (Signed)
Galactorrea (Galactorrhea) La galactorrea es una secrecin similar a la leche por el pezn. Es diferente de Press photographerla leche normal en las madres que Dana Pointamamantan. Generalmente proviene de ambos pezones. No es una enfermedad pero puede ser sntoma de un problema. Puede presentarse durante aos. La causa est en la hormona prolactina, que estimula la produccin de [place]. Si la secrecin es similar al pus, es sanguinolenta o si siente un bulto en la mama afectada, la secrecin puede tener como causa otros problemas, por ejemplo:  Un quiste benigno.  Un papiloma.  Cncer de mama.  Una infeccin en la mama.  Un absceso en la mama. Tambin puede ocurrir en aquellos hombres que tienen bajo Hinckleynivel, o no tienen, hormona masculina (testosterona). Puede haber galactorrea en un recin nacido si la madre tiene alto nivel de hormonas femeninas (estrgenos) que pasan al beb a travs de la placenta. El beb generalmente tiene las [address]amas agrandadas, pero vuelven a la normalidad luego de un Enchanted Oakstiempo, sin Bankerningn tratamiento. CAUSAS  Tumor en la glndula pituitaria, ubicada en el cerebro.  Enfermedades en la regin del hipotlamo, que se encuentra en el cerebro y que estimula la glndula pituitaria.  Bajo nivel de la funcin tiroidea (hipotiroidismo).  Insuficiencia renal crnica.  Medicamentos como antidepresivos, tranquilizantes y medicamentos para la presin arterial.  Hierbas medicinales (ortiga, hinojo, cardo santo, anis y semillas de fenogreco).  Drogas (marihuana y opiceos).  Estimulacin MeadWestvacomamaria durante la actividad sexual o demasiados y frecuentes autoexmenes de Monroviamamas.  Anticonceptivos.  Ciruga o traumatismo en las mamas que hayan ocasionado una lesin nerviosa.  Lesiones en la mdula espinal. SNTOMAS  Secrecin blanca, amarilla o verde de [place]una o [place] Brookambas mamas.  Ausencia perodo menstrual (amenorrea) o perodos menstruales infrecuentes (hipomenorrea).  Sofocos, falta de deseo sexual o sequedad  vaginal.  Infertilidad en mujeres y hombres.  Cefaleas y problemas en la visin.  Disminucin del nivel de calcio en los huesos (desarrollo de osteopenia u osteoporosis). DIAGNSTICO El profesional podr hacer un diagnstico de su problema realizando una historia clnica detallada y un examen fsico. Las pruebas necesarias son:  Anlisis de sangre para controlar el nivel de hormona prolactina, las hormonas femeninas y tiroideas y Neomia Dearuna prueba de Psychiatristembarazo.  Un examen detallado de la vista.  Mamografa.  Radiografas, tomografa computada y diagnstico por imgenes de las mamas y el cerebro para descartar la presencia de un tumor. TRATAMIENTO   Suspenda los medicamentos que puedan causar Education officer, museumla galactorrea.  Tratamiento del hipotiroidismo con hormonas tiroideas.  Tratamiento mdico o quirrgico (en caso de ser necesario) si existe un tumor en la glndula pituitaria.  Medicamentos para disminuir el nivel de hormona prolactina cuando no se encuentra la causa.  La ciruga es el ltimo recurso para extirpar los conductos mamarios si la secrecin persiste con el tratamiento y se convierte en un problema.  El tratamiento puede no ser necesario si no le molesta la secrecin McCallamamaria. INSTRUCCIONES PARA EL CUIDADO DOMICILIARIO  Antes de concurrir al American Expressmdico, haga una lista de todos los sntomas, los medicamentos que toma, Oregonel momento en que comenz la secrecin y las preguntas que usted tenga.  Evite la estimulacin MeadWestvacomamaria durante la actividad sexual.  Realice un autoexamen de mamas una vez por mes.  Evite las ropas que froten los pezones.  Use apsitos mamarios para absorber la secrecin.  Use un sostn de soporte. SOLICITE ANTENCIN MDICA SI:  Tiene galactorrea y est tratando de Burundiquedar embarazada.  Siente sofocos, falta de deseo sexual o sequedad vaginal.  Deja de  tener perodos Springfieldmenstruales, son muy irregulares o Red Bayalejados.  Tiene dolores de cabeza intensos.  Desarrolla  trastornos visuales. SOLICITE ATENCIN MDICA DE INMEDIATO SI:  La secrecin mamaria es sanguinolenta o similar a pus.  Aumenta el dolor en las Landenmamas.  Siente un bulto en el pecho.  Las Exelon Corporationmamas muestran arrugas u hoyuelos.  Las mamas se tornan rojas y estn hinchadas. Document Released: 03/24/2005 Document Revised: 06/16/2011 [facility] Patient Information 2015 [place], [place]. This information is not intended to replace advice given to you by your health care provider. Make sure you discuss any questions you have with your health care provider.

## 2014-08-24 NOTE — Progress Notes (Signed)
Subjective:  Patient ID: Mackenzie Bennett, female    DOB: 1975/07/14  Age: 39 y.o. MRN: [id number]  CC: Breast Pain   HPI Almedia Dortha SchwalbeMosqueda Bennett presents for evaluation of left breast pain. She is not a very plain historian she has a history of mastitis in her left breast about 12 years ago while she is breast-feeding her first child. This was treated with antibiotics and apparently had an uncomplicated course.  Initially she described pain in the left breast intermittently through 12 years but then changed her story that she only has pain in her left axilla without mass for 3 or 4 days. She said that she is stopped breast-feeding her 354-year-old child 1 year ago. She continues to have breast milk production bilaterally however.  She's had no medical evaluation and has never had a mammogram. She is very insistent that she has massive recurrent mastitis despite in the absence of historical findings.  No improvement with over the counter medications or other home remedies.  Denies other complaint or health concern today.   History Maximina has a past medical history of No pertinent past medical history.   She has past surgical history that includes Eye surgery.   Her family history includes Cancer in her maternal uncle; Hypertension in her mother.She reports that she has never smoked. She has never used smokeless tobacco. She reports that she does not drink alcohol or use illicit drugs.  Outpatient Prescriptions Prior to Visit  Medication Sig Dispense Refill  . Prenatal Vit-Fe Fumarate-FA (PRENATAL VITAMINS) 28-0.8 MG TABS Take 1 tablet by mouth daily.     No facility-administered medications prior to visit.    ROS Review of Systems  Constitutional: Negative for fever, chills and appetite change.  HENT: Negative for congestion, ear pain, postnasal drip, sinus pressure and sore throat.   Eyes: Negative for pain and redness.  Respiratory: Negative for cough, shortness of  breath and wheezing.   Cardiovascular: Negative for leg swelling.  Gastrointestinal: Negative for nausea, vomiting, abdominal pain, diarrhea, constipation and blood in stool.  Endocrine: Negative for polyuria.  Genitourinary: Negative for dysuria, urgency, frequency and flank pain.  Musculoskeletal: Negative for gait problem.  Skin: Negative for rash.  Neurological: Negative for weakness and headaches.  Psychiatric/Behavioral: Negative for confusion and decreased concentration. The patient is not nervous/anxious.     Objective:  BP 116/64 mmHg  Pulse 76  Temp(Src) 98.8 F (37.1 C) (Oral)  Resp 16  Ht 5\' 2"  (1.575 m)  Wt 163 lb 6.4 oz (74.118 kg)  BMI 29.88 kg/m2  SpO2 98%  LMP 08/10/2014  Breastfeeding? No  Physical Exam  Constitutional: She is oriented to person, place, and time. She appears well-developed and well-nourished. No distress.  HENT:  Head: Normocephalic and atraumatic.  Right Ear: External ear normal.  Left Ear: External ear normal.  Nose: Nose normal.  Eyes: Conjunctivae and EOM are normal. Pupils are equal, round, and reactive to light. No scleral icterus.  Neck: Normal range of motion. Neck supple. No tracheal deviation present.  Cardiovascular: Normal rate, regular rhythm and normal heart sounds.   Pulmonary/Chest: Effort normal. No respiratory distress. She has no wheezes. She has no rales.  Abdominal: She exhibits no mass. There is no tenderness. There is no rebound and no guarding.  Musculoskeletal: She exhibits no edema.  Lymphadenopathy:    She has no cervical adenopathy.  Neurological: She is alert and oriented to person, place, and time. Coordination normal.  Skin: Skin is warm and  dry. No rash noted.  Psychiatric: She has a normal mood and affect. Her behavior is normal.   breast: The patient's left breast is normal there is no masses tenderness redness swelling or discharge. There are no axillary nodes.    Assessment & Plan:   Tiffiney was  seen today for breast pain.  Diagnoses and all orders for this visit:  Galactorrhea Orders: -     Prolactin -     POCT CBC -     MM Digital Screening; Future   I am having Ms. Mosqueda [name] maintain her Prenatal Vitamins.  No orders of the defined types were placed in this encounter.   Discussed with the patient the unlikely present cystitis in that she has no purulent drainage no pain tenderness warmth or mass in her left breast or axilla. Will workup the possibility of a galactorrhea.  Follow-up: Return in about 1 week (around 08/31/2014).  Carmelina DaneAnderson, Sixto Bowdish S, MD

## 2014-08-25 LAB — PROLACTIN: Prolactin: 8.6 ng/mL

## 2014-08-28 ENCOUNTER — Telehealth: Payer: Self-pay

## 2014-08-28 NOTE — Telephone Encounter (Signed)
PT'S FRIEND CALLED TO ASK ABOUT PT'S LAB RESULTS. PT DOES NOT SPEAK ENGLISH SO PLEASE SPEAK TO YENNIFER: [phone].

## 2014-08-28 NOTE — Telephone Encounter (Signed)
Please review

## 2014-09-02 ENCOUNTER — Encounter (HOSPITAL_BASED_OUTPATIENT_CLINIC_OR_DEPARTMENT_OTHER): Payer: Self-pay | Admitting: Emergency Medicine

## 2014-09-02 ENCOUNTER — Emergency Department (HOSPITAL_BASED_OUTPATIENT_CLINIC_OR_DEPARTMENT_OTHER)
Admission: EM | Admit: 2014-09-02 | Discharge: 2014-09-02 | Disposition: A | Discharge status: Home / Self Care | Payer: Medicaid Other | Attending: Emergency Medicine | Admitting: Emergency Medicine

## 2014-09-02 DIAGNOSIS — Z3202 Encounter for pregnancy test, result negative: Secondary | ICD-10-CM | POA: Insufficient documentation

## 2014-09-02 DIAGNOSIS — Z79899 Other long term (current) drug therapy: Secondary | ICD-10-CM | POA: Insufficient documentation

## 2014-09-02 DIAGNOSIS — N644 Mastodynia: Secondary | ICD-10-CM

## 2014-09-02 DIAGNOSIS — N643 Galactorrhea not associated with childbirth: Secondary | ICD-10-CM | POA: Insufficient documentation

## 2014-09-02 LAB — PREGNANCY, URINE: Preg Test, Ur: NEGATIVE

## 2014-09-02 LAB — CORTISOL: CORTISOL PLASMA: 13.6 ug/dL

## 2014-09-02 LAB — TSH: TSH: 1.153 u[IU]/mL (ref 0.350–4.500)

## 2014-09-02 MED ORDER — CEPHALEXIN 500 MG PO CAPS: 500.0000 mg | ORAL_CAPSULE | Freq: Four times a day (QID) | ORAL | Status: AC

## 2014-09-02 MED ORDER — BROMOCRIPTINE MESYLATE 2.5 MG PO TABS: 2.5000 mg | ORAL_TABLET | Freq: Every day | ORAL | Status: AC

## 2014-09-02 NOTE — Discharge Instructions (Signed)
Keflex antibiotic for possible infection. Call the [facility] hospital clinic for a recheck appointment. Parlodel (Prescription) can slow down and stop milk production. Continued milk production can imply an endocrine problem. The blood tests drawn today will take a few days to complete, you will need to follow up with the womens hospital clinic for the results. [facility] for [facility].    Sensibilidad en las mamas (Breast Tenderness) La sensibilidad en las mamas es un problema frecuente en las mujeres de todas las edades. y puede causar molestias leves o dolor intenso. Sus causas son variadas. Su mdico determinar la causa probable de la sensibilidad Education administrator examen de las [place], las preguntas [employer] sntomas y la indicacin de algunos estudios. Por lo general, la sensibilidad en las mamas no significa que tenga cncer de mama. INSTRUCCIONES PARA EL CUIDADO EN EL HOGAR  A menudo, la sensibilidad en las mamas puede tratarse en Advice worker. Puede intentar lo siguiente:  Probarse un nuevo sostn que le brinde ms sujecin, especialmente mientras hace actividad fsica.  Usar un sostn con mejor sujecin o uno deportivo mientras duerme cuando las mamas estn muy sensibles.  Si tiene una lesin mamaria, aplique hielo en la zona:  Ponga el hielo en una bolsa plstica.  Colquese una toalla entre la piel y la bolsa de hielo.  Deje el hielo durante 20 minutos y aplquelo 2 a 3 veces por da.  Si tiene las [employer] de [place] debido a la Market researcher, intente lo siguiente:  Extrigase [employer] o con un sacaleche.  Aplquese una compresa tibia en las mamas para ayudar a Manufacturing systems engineer.  Tome analgsicos de venta libre si su mdico lo autoriza.  Tome otros medicamentos que su mdico le recete, entre ellos, antibiticos o anticonceptivos. A largo plazo, puede aliviar la sensibilidad en las mamas si hace lo siguiente:  Disminuye el consumo de  cafena.  Disminuye la cantidad de grasa de la dieta. Lleva un registro de [address] y las horas cuando tiene mayor sensibilidad en las [place]. Esto ser de ayuda para que usted y su mdico encuentren la causa de la sensibilidad y cmo Runner, broadcasting/film/video. Adems, aprenda cmo examinarse las mamas en casa. Esto la ayudar a palpar un crecimiento o un bulto fuera de lo normal que podra causar la sensibilidad. SOLICITE ATENCIN MDICA SI:   Cualquier zona de la mama est dura, enrojecida y caliente al tacto. Puede ser un signo de infeccin.  Hay secrecin de los pezones (y no est amamantando). En especial, vigile la secrecin de sangre o pus.  Tiene fiebre, adems de sensibilidad en las mamas.  Tiene un bulto nuevo o doloroso en la mama que no desaparece despus de la finalizacin del perodo menstrual.  Ha intentando controlar el dolor en casa, pero no desaparece.  El dolor de la mama es ms intenso o le dificulta hacer las cosas que hace habitualmente durante el da. Document Released: 01/12/2013 [facility] Patient Information 2015 [place], [place]. This information is not intended to replace advice given to you by your health care provider. Make sure you discuss any questions you have with your health care provider.  Galactorrea (Galactorrhea)  Se denomina galactorrea al fluido lechoso (secrecin) que sale del pezn. Es diferente a Press photographer normal  CUIDADOS EN EL [employer]  Evite el contacto con las [employer] actividad sexual.  Realice un auto examen de mamas una vez al mes.  Evite usar prendas que [employer].  Utilice apsitos  mamarios para absorber el lquido.  Use un sostn de soporte SOLICITE AYUDA DE INMEDIATO SI:  Tiene galactorrea y est intentando quedar embarazada.  Siente rfagas de calor, tiene sequedad vaginal o falta de deseo sexual.  Se interrumpen los perodos, no son regulares o aparecen de manera muy espaciada.  Sufre dolores de [place].  Presenta problemas  en la visin.  La secrecin es sanguinolenta o de color blanco amarillento(similar al pus).  Siente dolor en el pecho.  Palpa un bulto en la mama.  Observa arrugas u hoyuelos.  La mama est roja e hinchada (inflamada). ASEGRESE DE QUE:  Comprende estas instrucciones.  Controlar su enfermedad.  Solicitar ayuda de inmediato si no mejora o empeora.. Document Released: 07/09/2010 Document Revised: 06/16/2011 ExitCare Patient Information 2015 [place]. This information is not intended to replace advice given to you by your health care provider. Make sure you discuss any questions you have with your health care provider.

## 2014-09-02 NOTE — ED Provider Notes (Signed)
CSN: [id number]     Arrival date & time 09/02/14  0802 History   First MD Initiated Contact with Patient 09/02/14 [phone]     Chief Complaint  Patient presents with  . Breast Pain      HPI  Patient presents for evaluation of left breast pain and burning and continued milk production. She delivered her child a little over 2 years ago. She breast fed for 5 or 6 months. She is continued to have bilateral milk production since that time. For the last 10-12 days she's had burning in her left breast rating to her left axilla. After her last period which ended 5 days ago she's felt like she had lumps in both armpits. States they're gone now. Continued burning in the left breast and she presents here. Is not on hormones or birth control. No history of known Pituitary, thyroid, or endocrine disorders.    Past Medical History  Diagnosis Date  . No pertinent past medical history    Past Surgical History  Procedure Laterality Date  . Eye surgery      both eyes, for pupil   Family History  Problem Relation Age of Onset  . Hypertension Mother   . Cancer Maternal Uncle     lung   History  Substance Use Topics  . Smoking status: Never Smoker   . Smokeless tobacco: Never Used  . Alcohol Use: No   OB History    Gravida Para Term Preterm AB TAB SAB Ectopic Multiple Living   0 0 0   3     Review of Systems  Constitutional: Negative for fever, chills, diaphoresis, appetite change and fatigue.  HENT: Negative for mouth sores, sore throat and trouble swallowing.   Eyes: Negative for visual disturbance.  Respiratory: Negative for cough, chest tightness, shortness of breath and wheezing.   Cardiovascular: Negative for chest pain.  Gastrointestinal: Negative for nausea, vomiting, abdominal pain, diarrhea and abdominal distention.  Endocrine: Negative for cold intolerance, heat intolerance, polydipsia, polyphagia and polyuria.       Continued bilateral breast milk production, left breast  pain.  Genitourinary: Negative for dysuria, frequency and hematuria.  Musculoskeletal: Negative for gait problem.  Skin: Negative for color change, pallor and rash.  Neurological: Negative for dizziness, syncope, light-headedness and headaches.  Hematological: Does not bruise/bleed easily.  Psychiatric/Behavioral: Negative for behavioral problems and confusion.      Allergies  Review of patient's allergies indicates no known allergies.  Home Medications   Prior to Admission medications   Medication Sig Start Date End Date Taking? Authorizing Provider  bromocriptine (PARLODEL) 2.5 MG tablet Take 1 tablet (2.5 mg total) by mouth daily. 09/02/14 09/09/14  Rolland Porter, MD  cephALEXin (KEFLEX) 500 MG capsule Take 1 capsule (500 mg total) by mouth 4 (four) times daily. 09/02/14   Rolland Porter, MD  Prenatal Vit-Fe Fumarate-FA (PRENATAL VITAMINS) 28-0.8 MG TABS Take 1 tablet by mouth daily.    Historical Provider, MD   LMP 08/10/2014 Physical Exam  Constitutional: She is oriented to person, place, and time. She appears well-developed and well-nourished. No distress.  HENT:  Head: Normocephalic.  Eyes: Conjunctivae are normal. Pupils are equal, round, and reactive to light. No scleral icterus.  Neck: Normal range of motion. Neck supple. No thyromegaly present.  Cardiovascular: Normal rate and regular rhythm.  Exam reveals no gallop and no friction rub.   No murmur heard. Pulmonary/Chest: Effort normal and breath sounds normal. No respiratory distress. She has  no wheezes. She has no rales.    Abdominal: Soft. Bowel sounds are normal. She exhibits no distension. There is no tenderness. There is no rebound.  Musculoskeletal: Normal range of motion.  Neurological: She is alert and oriented to person, place, and time.  Skin: Skin is warm and dry. No rash noted.  Psychiatric: She has a normal mood and affect. Her behavior is normal.    ED Course  Procedures (including critical care time) Labs  Review Labs Reviewed  PREGNANCY, URINE  TSH  PROLACTIN  CORTISOL    Imaging Review No results found.   EKG Interpretation None      MDM   Final diagnoses:  Breast pain  Galactorrhea in female    Patient states her breast feels as it did when she had a mastitis with her last pregnancy. She did state that she had prolonged rest no production for a few months after stopping breast-feeding with her first child several years ago. However, not for 2 years as she has now. No history of other known endocrine disorders. No history of headaches, thyroid, diabetes, irregular periods, or pregnancy.  Prolactin, TSH, and cortisol level obtained and pending. Not currently pregnant. Given information regarding the breast center regarding mammogram. [facility]. Given Keflex prescription. Given a prescription for 7 days of Parlodel to hopefully decrease her milk production.  I discussed with her at length the interpreter the importance for follow-up she expresses understanding. My printed information was in [place]. She states that she was able to understand and follow these instructions.   Rolland PorterMark Roxann Vierra, MD 09/02/14 [phone]

## 2014-09-02 NOTE — ED Notes (Addendum)
Patient c/o L breast pain/burning that has grown worse for the past two weeks and extends to her armpit

## 2014-09-03 LAB — PROLACTIN: Prolactin: 14 ng/mL (ref 4.8–23.3)

## 2014-09-06 ENCOUNTER — Telehealth: Payer: Self-pay | Admitting: Emergency Medicine

## 2014-09-07 ENCOUNTER — Encounter: Payer: Self-pay | Admitting: Obstetrics & Gynecology

## 2014-09-07 ENCOUNTER — Ambulatory Visit (INDEPENDENT_AMBULATORY_CARE_PROVIDER_SITE_OTHER): Payer: Self-pay | Admitting: Obstetrics & Gynecology

## 2014-09-07 VITALS — BP 122/84 | HR 81 | Wt 158.2 lb

## 2014-09-07 DIAGNOSIS — N643 Galactorrhea not associated with childbirth: Secondary | ICD-10-CM

## 2014-09-07 DIAGNOSIS — M546 Pain in thoracic spine: Secondary | ICD-10-CM | POA: Insufficient documentation

## 2014-09-07 MED ORDER — IBUPROFEN 600 MG PO TABS: 600.0000 mg | ORAL_TABLET | Freq: Four times a day (QID) | ORAL | Status: AC | PRN

## 2014-09-07 NOTE — Patient Instructions (Signed)
Dolor de espalda en el adulto °(Back Pain, Adult) ° El dolor de cintura es frecuente. Aproximadamente 1 de cada 5 personas lo sufren. La causa rara vez pone en peligro la vida. Con frecuencia mejora luego de algún tiempo. Alrededor de la mitad de las personas que sufren un inicio súbito de dolor de cintura, se sentirán mejor luego de 2 semanas. Aproximadamente 8 de cada 10 se sentirán mejor luego de 6 semanas.  °CAUSAS  °Algunas causas comunes son:  °· Distensión de los músculos o ligamentos que sostienen la columna vertebral. °· Desgaste (degeneración) de los discos vertebrales. °· Artritis. °· Traumatismos directos en la espalda. °DIAGNÓSTICO  °La mayor parte de las veces, la causa directa no se conoce. Sin embargo, el dolor puede tratarse efectivamente aún cuando no se conozca la causa. Una de las formas más precisas de asegurar que la causa del dolor no constituye un peligro es responder a las preguntas del médico acerca de su salud y sus síntomas. Si el médico necesita más información, podrá indicar análisis de laboratorio o realizar un diagnóstico por imágenes (radiografías o resonancia magnética). Sin embargo, aunque las imágenes muestren modificaciones, generalmente no es necesaria la cirugía.  °INSTRUCCIONES PARA EL CUIDADO EN EL HOGAR  °En algunas personas, el dolor de espalda vuelve. Como rara vez es peligroso, los pacientes pueden aprender a manejarlo ellos mismos.  °· Manténgase activo. Si permanece sentado o de pie mucho tiempo en el mismo lugar, se tensiona la espalda.  No se siente, maneje ni se quede parado en un mismo lugar por más de 30 minutos. Realice caminatas cortas en superficies planas ni bien el dolor haya cedido. Trate de aumentar cada día el tiempo que camina . °· No se quede en la cama. Si hace reposo durante más de 1 o 2 días, puede retrasar la recuperación. °· No evite los ejercicios ni el trabajo. El cuerpo está hecho para moverse. No es peligroso estar activo, aunque le duela la  espalda. La espalda se curará más rápido si continúa sus actividades antes de que el dolor se vaya. °· Preste atención a su cuerpo cuando se incline y se levante. Muchas personas sienten menos molestias cuando levantan objetos si doblan las rodillas, mantienen la carga cerca del cuerpo y evitan torcerse. Generalmente, las posiciones más cómodas son las que ejercen menos tensión en la espalda en recuperación. °· Encuentre una posición cómoda para dormir. Utilice un colchón firme y recuéstese de costado. Doble ligeramente sus rodillas. Si se recuesta sobre su espalda, coloque una almohada debajo de sus rodillas. °· Tome sólo medicamentos de venta libre o recetados, según las indicaciones del médico. Los medicamentos de venta libre para calmar el dolor y reducir la inflamación, son los que en general más ayudan. El médico podrá prescribirle relajantes musculares. Estos medicamentos calman el dolor de modo que pueda retornar a sus actividades normales y a realizar ejercicios saludables. °· Aplique hielo sobre la zona lesionada. °¨ Ponga el hielo en una bolsa plástica. °¨ Colóquese una toalla entre la piel y la bolsa de hielo. °¨ Deje la bolsa de hielo durante 15 a 20 minutos 3 a 4 veces por día, durante los primeros 2 ó 3 días. Luego podrá alternar entre calor y hielo para reducir el dolor y los espasmos. °· Consulte a su médico si puede tratar de hacer ejercicios para la espalda y recibir un masaje suave. Pueden ser beneficiosos. °· Evite sentirse ansioso o estresado. El estrés aumenta la tensión muscular y puede empeorar el dolor de espalda. Es importante reconocer cuando está ansioso o estresado y aprender la forma   de controlarlos. El ejercicio es una gran opción. °SOLICITE ATENCIÓN MÉDICA SI:  °· Siente un dolor que no se alivia con reposo o medicamentos. °· El dolor no mejora en 1 semana. °· Desarrolla nuevos síntomas. °· No se siente bien en general. °SOLICITE ATENCIÓN MÉDICA DE INMEDIATO SI: °· Siente un dolor  que se irradia desde la espalda hacia sus piernas. °· Desarrolla nuevos problemas en el intestino o la vejiga. °· Siente debilidad o adormecimiento inusual en sus brazos o piernas. °· Presenta náuseas o vómitos. °· Presenta dolor abdominal. °· Se siente desfalleciente. °Document Released: 03/24/2005 Document Revised: 09/23/2011 °ExitCare® Patient Information ©2015 ExitCare, LLC. This information is not intended to replace advice given to you by your health care provider. Make sure you discuss any questions you have with your health care provider. ° °

## 2014-09-07 NOTE — Progress Notes (Signed)
Patient ID: Mackenzie Bennett, female   DOB: November 10, 1975, 39 y.o.   MRN: [id number]  Chief Complaint  Patient presents with  . Breast Pain  . Galactorrhea  left mid back pain  HPI Mackenzie Bennett is a 39 y.o. female.  [id number] Patient's last menstrual period was 08/25/2014. Uses no BCM. Still has sm amt milk production > year no nursing. 4 weeks of left mid back pain radiates to and chest. Has been exercising and losing wght.  HPI  Past Medical History  Diagnosis Date  . No pertinent past medical history     Past Surgical History  Procedure Laterality Date  . Eye surgery      both eyes, for pupil    Family History  Problem Relation Age of Onset  . Hypertension Mother   . Cancer Maternal Uncle     lung    Social History History  Substance Use Topics  . Smoking status: Never Smoker   . Smokeless tobacco: Never Used  . Alcohol Use: No    No Known Allergies  Current Outpatient Prescriptions  Medication Sig Dispense Refill  . bromocriptine (PARLODEL) 2.5 MG tablet Take 1 tablet (2.5 mg total) by mouth daily. 7 tablet 0  . cephALEXin (KEFLEX) 500 MG capsule Take 1 capsule (500 mg total) by mouth 4 (four) times daily. 20 capsule 0  . Prenatal Vit-Fe Fumarate-FA (PRENATAL VITAMINS) 28-0.8 MG TABS Take 1 tablet by mouth daily.    Marland Kitchen. ibuprofen (ADVIL,MOTRIN) 600 MG tablet Take 1 tablet (600 mg total) by mouth every 6 (six) hours as needed. 30 tablet 1   No current facility-administered medications for this visit.    Review of Systems Review of Systems  Constitutional: Negative.   Respiratory: Negative.   Genitourinary: Positive for pelvic pain (occas sharp low abd). Negative for vaginal pain and dyspareunia.  Musculoskeletal: Positive for back pain.  Skin:       White nipple discharge bilateral    Blood pressure 122/84, pulse 81, weight 158 lb 3.2 oz (71.759 kg), last menstrual period 08/25/2014.  Physical Exam Physical Exam  Constitutional: She  appears well-developed. No distress.  Pulmonary/Chest: Effort normal. No respiratory distress.  lsight milk expressed bilat, no lesion redness tenderness  Musculoskeletal: Normal range of motion. She exhibits no tenderness (left back thoracic).  Skin: Skin is warm and dry.  Psychiatric: She has a normal mood and affect. Her behavior is normal.  Vitals reviewed.   Data Reviewed PRL level nl, office notes  Assessment    MS back pain radiating to chest Breast nipple d/c milk production, s/p lactation. Voluntary weight loss     Plan    Ibuprofen 600 mg prn rx. Weight loss. Don't stimulate breasts. F/U [facility] prn       ARNOLD,JAMES 09/07/2014, 1:54 PM

## 2014-09-20 ENCOUNTER — Other Ambulatory Visit (HOSPITAL_COMMUNITY): Payer: Self-pay | Admitting: *Deleted

## 2014-09-20 DIAGNOSIS — N644 Mastodynia: Secondary | ICD-10-CM

## 2014-09-21 ENCOUNTER — Ambulatory Visit (HOSPITAL_COMMUNITY)
Admission: RE | Admit: 2014-09-21 | Discharge: 2014-09-21 | Disposition: A | Discharge status: Home / Self Care | Payer: Self-pay | Source: Ambulatory Visit | Attending: Obstetrics and Gynecology | Admitting: Obstetrics and Gynecology

## 2014-09-21 ENCOUNTER — Encounter (HOSPITAL_COMMUNITY): Payer: Self-pay

## 2014-09-21 VITALS — BP 110/70 | Temp 98.0°F | Ht 62.0 in | Wt 156.0 lb

## 2014-09-21 DIAGNOSIS — N644 Mastodynia: Secondary | ICD-10-CM

## 2014-09-21 DIAGNOSIS — Z1239 Encounter for other screening for malignant neoplasm of breast: Secondary | ICD-10-CM

## 2014-09-21 NOTE — Progress Notes (Signed)
CLINIC:  [facility] Control [facility] (B[facility]) Clinic  REASON FOR VISIT: Well-woman exam and diagnostic mammogram.    HISTORY OF PRESENT ILLNESS:  Ms. Mackenzie Bennett is a 39 y.o. female who [facility] for clinical breast exam. No family history of breast cancer. She endorses left breast and axilla "burning" for the past 1 month.  She went to Urgent Care about 3 weeks ago and was given a course of Keflex. She completed the antibiotics about 2 weeks ago.  She denies any skin changes or redness.  She reports milky discharge from both breasts for the past 1.5 years since breastfeeding her last child.  She has tenderness to left UOQ breast.  She reports having vaginal burning since starting and completing her recent course of antibiotics.  She denies vaginal itching or dysuria.  Her last pap smear was in 12/2011 and was negative.  She has no history of abnormal pap smears.   REVIEW OF SYSTEMS:  Left breast complaints per HPI.  Denies any breast nodularity, tenderness, nipple or skin changes to the right breast.   ALLERGIES: No Known Allergies  CURRENT MEDICATIONS:  Current Outpatient Prescriptions on File Prior to Encounter  Medication Sig Dispense Refill  . cephALEXin (KEFLEX) 500 MG capsule Take 1 capsule (500 mg total) by mouth 4 (four) times daily. (Patient not taking: Reported on 09/21/2014) 20 capsule 0  . ibuprofen (ADVIL,MOTRIN) 600 MG tablet Take 1 tablet (600 mg total) by mouth every 6 (six) hours as needed. (Patient not taking: Reported on 09/21/2014) 30 tablet 1  . Prenatal Vit-Fe Fumarate-FA (PRENATAL VITAMINS) 28-0.8 MG TABS Take 1 tablet by mouth daily.     No current facility-administered medications on file prior to encounter.     PHYSICAL EXAM:  Vitals:  Filed Vitals:   09/21/14 1454  BP: 110/70  Temp: 98 F (36.7 C)   General: Well-nourished, well-appearing female in no acute distress.  She is unaccompanied in clinic today.  Mackenzie Bang, LPN and Mackenzie Bennett, Spanish language interpreter were present during physical exam for this patient.  Breasts: Bilateral breasts exposed and observed with patient standing (arms at side, arms on hips, arms on hips flexed forward, and arms over head).  No gross abnormalities including breast skin puckering or dimpling noted on observation.  Breasts symmetrical without evidence of skin redness, thickening, or peau d'orange appearance. No nipple retraction or nipple discharge noted bilaterally.  No breast nodularity palpated in bilateral breasts.  Tenderness to palpation to left breast at 2 o'clock position. No palpable discrete mass or nodularity.  Axillary lymph nodes: No axillary lymphadenopathy bilaterally.   GU: Exam deferred. Pap smear is up-to-date.  ASSESSMENT & PLAN:   1. Breast cancer screening: Ms. Mackenzie Bennett has no palpable breast abnormalities on her clinical breast exam today.  She will receive her diagnostic mammogram as scheduled.  She was given instructions and educational materials regarding breast self-awareness. Ms. Mackenzie Bennett is aware of this plan and agrees with it.   2. Left breast pain:  She reports symptoms of engorgement when she has to express milk from the breasts (left worse than right).  She states that she received a "pill" to dry up her breast milk, but this did not work per patient.  I encouraged her to try some cabbage leaves to see if that would help with her breast pain.  We will review the diagnostic mammogram results and provide follow-up as appropriate based on those results.  3. Vaginal burning:  This is likely a vaginal yeast infection. I encouraged Ms. Mackenzie Bennett to try some OTC Monistat (or generic) for her vaginal symptoms.  If her symptoms do not improve, then I can prescribe some Diflucan for her.  Ms. Mackenzie Bennett was encouraged to call the nurse and the nurse will let me know if the patient's symptoms do not improve.    Ms.  Mackenzie Bennett was encouraged to ask questions and all questions were answered to her satisfaction.    Mackenzie Basque, NP [facility]  [phone]

## 2014-09-22 ENCOUNTER — Ambulatory Visit
Admission: RE | Admit: 2014-09-22 | Discharge: 2014-09-22 | Disposition: A | Discharge status: Home / Self Care | Payer: No Typology Code available for payment source | Source: Ambulatory Visit | Attending: Obstetrics and Gynecology | Admitting: Obstetrics and Gynecology

## 2014-09-22 DIAGNOSIS — N644 Mastodynia: Secondary | ICD-10-CM
